# Patient Record
Sex: Male | Born: 1940 | Race: White | Hispanic: No | Marital: Married | Smoking: Current every day smoker
Health system: Southern US, Community
[De-identification: ages and names within clinical notes are randomized; demographics above are authoritative.]

## PROBLEM LIST (undated history)

## (undated) DIAGNOSIS — J449 Chronic obstructive pulmonary disease, unspecified: Secondary | ICD-10-CM

## (undated) DIAGNOSIS — I739 Peripheral vascular disease, unspecified: Secondary | ICD-10-CM

## (undated) DIAGNOSIS — I4891 Unspecified atrial fibrillation: Secondary | ICD-10-CM

## (undated) DIAGNOSIS — C61 Malignant neoplasm of prostate: Secondary | ICD-10-CM

## (undated) DIAGNOSIS — I10 Essential (primary) hypertension: Secondary | ICD-10-CM

## (undated) DIAGNOSIS — I420 Dilated cardiomyopathy: Secondary | ICD-10-CM

## (undated) DIAGNOSIS — Z720 Tobacco use: Secondary | ICD-10-CM

## (undated) DIAGNOSIS — C679 Malignant neoplasm of bladder, unspecified: Secondary | ICD-10-CM

## (undated) DIAGNOSIS — H269 Unspecified cataract: Secondary | ICD-10-CM

---

## 2007-04-05 ENCOUNTER — Ambulatory Visit: Payer: Self-pay | Admitting: Cardiology

## 2007-04-05 ENCOUNTER — Inpatient Hospital Stay (HOSPITAL_COMMUNITY): Admission: EM | Admit: 2007-04-05 | Discharge: 2007-04-06 | Payer: Self-pay | Admitting: *Deleted

## 2007-04-05 ENCOUNTER — Ambulatory Visit: Payer: Self-pay | Admitting: Internal Medicine

## 2007-04-08 ENCOUNTER — Ambulatory Visit: Payer: Self-pay | Admitting: Cardiology

## 2007-04-12 ENCOUNTER — Ambulatory Visit: Payer: Self-pay | Admitting: Family Medicine

## 2007-04-19 ENCOUNTER — Ambulatory Visit: Payer: Self-pay | Admitting: Cardiology

## 2007-04-28 ENCOUNTER — Ambulatory Visit: Payer: Self-pay | Admitting: Cardiology

## 2007-05-05 ENCOUNTER — Ambulatory Visit: Payer: Self-pay | Admitting: Cardiology

## 2007-05-13 ENCOUNTER — Ambulatory Visit: Payer: Self-pay | Admitting: Cardiology

## 2007-05-20 ENCOUNTER — Ambulatory Visit: Payer: Self-pay | Admitting: Cardiology

## 2007-05-26 ENCOUNTER — Ambulatory Visit: Payer: Self-pay | Admitting: Cardiology

## 2007-06-03 ENCOUNTER — Ambulatory Visit: Payer: Self-pay | Admitting: Cardiology

## 2007-06-10 ENCOUNTER — Ambulatory Visit: Payer: Self-pay | Admitting: Cardiology

## 2011-01-27 NOTE — Assessment & Plan Note (Signed)
[facility]                          [facility] NOTE   NAME:[name], [name]                         MRN:          [id number]  DATE:05/26/2007                            DOB:          02-Dec-1940    PRIMARY CARDIOLOGIST:  Dr. Lewayne Bunting   PRIMARY CARE PHYSICIAN:  None.   REASON FOR VISIT:  Followup.   HISTORY OF PRESENT ILLNESS:  [name] [name] is a 70 year old male patient  with a history of persistent atrial fibrillation with RVR, who presents  to the office today for followup.  He was last seen by [name] [name],  on April 12, 2007.  At that time, his Diltiazem was up-titrated, and  Toprol XL 25 mg a day was added for better blood pressure and heart rate  control.  The patient remained asymptomatic.  He denies any  palpitations, chest pain, shortness of breath, syncope, near-syncope.  Denies any orthopnea, PND, or pedal edema.   CURRENT MEDICATIONS:  1. Coumadin as directed.  2. Vitamin B12.  3. Toprol XL 25 mg daily.  4. Cardizem CD 300 mg daily.   ALLERGIES:  No known drug allergies.   PHYSICAL EXAM:  He is a well-nourished, well-developed male in no acute  distress.  Blood pressure 137/77, pulse 88, weight 163 pounds.  HEENT:  Normal.  NECK:  Without JVD.  CARDIAC:  Normal S1, S2.  Regular rate and rhythm.  LUNGS:  Clear to auscultation bilaterally.  ABDOMEN:  Soft, nontender.  EXTREMITIES:  Without edema.  Calves are soft, nontender.  Skin is warm  and dry.  NEUROLOGIC:  He is alert and oriented x3.  Cranial nerves 2-12 are  grossly intact.   Electrocardiogram reveals atrial fibrillation with a heart rate of 77,  no acute changes.   DATA BASE:  PT and INR dated May 20, 2007, 2.2; May 13, 2007,  1.6; May 05, 2007, 1.8; April 28, 2007, 2.3.   IMPRESSION:  1. Persistent atrial fibrillation with controlled ventricular rate.      a.     CHAD2 score 1.  2. Coumadin anticoagulation in anticipation of upcoming DC     cardioversion (elective).  3. Hypertension.  4. Chronic obstructive pulmonary disease.  5. Cataracts.  6. History of bladder cancer.   PLAN:  The patient presents to the office today for followup.  His blood  pressure and heart rate look much better.  He is remaining asymptomatic  with his atrial fibrillation.  We currently have him on Coumadin in  anticipation of elective cardioversion.  Once his INRs have been  therapeutic for at least 3 weeks consecutively, we will set him up for  DC cardioversion at [facility].  Since his [place] score is less  than 2, he would likely be a candidate for aspirin therapy.  He would,  of course, need to complete 4 weeks of therapeutic Coumadin therapy  postcardioversion.  Of note, his ejection fraction was 45-50% when he  was initially evaluated.  This study was difficult to read secondary to  his rapid ventricular response.  Once he is  restored to normal sinus  rhythm, we may want to consider repeating an echocardiogram to ensure  that his left ventricular function has remained normal.  I will bring  him back in routine followup in the next 3 months to ensure that he does  have normal left ventricular function.      Tereso Newcomer, PA-C  Electronically Signed      Learta Codding, MD,FACC  Electronically Signed   SW/MedQ  DD: 05/26/2007  DT: 05/27/2007  Job #: [id number]

## 2011-01-27 NOTE — Assessment & Plan Note (Signed)
[facility]                          [facility] NOTE   NAME:[name], [name]                         MRN:          [id number]  DATE:04/12/2007                            DOB:          [birth year]    PRIMARY CARDIOLOGIST:  Dr. Lewayne Bunting (new)   REASON FOR VISIT:  Post hospital followup.   [name] is a 70 year old male, with no prior cardiac history,  recently referred directly to [facility] from [facility] for further evaluation and management of asymptomatic paroxysmal  atrial fibrillation.  The patient was to have undergone elective  cataract surgery, but was found to have tachycardia and an  electrocardiogram was obtained revealing atrial fibrillation with RVR.   The patient was briefly hospitalized at [facility], ruled out for  myocardial infarction with negative serial cardiac enzymes, had a normal  TSH, and was treated initially with IV Diltiazem for rate control.  He  was discharged on Cardizem 240 daily.  He was also started on IV heparin  and transitioned to Coumadin with plans to proceed with elective DC  cardioversion in approximately 4 weeks.   His initial post hospital followup pro time was 1.1 just 4 days ago;  today, however, it has reached a therapeutic level of 2.1.   A 2-D echocardiogram was done at [facility]:  EF 45-50% with no definite  wall motion abnormalities; mild aortic root dilatation/mild aortic  regurgitation; mild mitral regurgitation; normal right ventricle; mild  left atrial dilatation.   As before, the patient continues to deny any sensation of  tachypalpitations.  He denies any exertional chest discomfort and  reports only some mild exertional dyspnea as the day progresses.  He  denies any PND, orthopnea, or lower extremity edema.   Electrocardiogram today reveals atrial fibrillation at 118 BPM with  normal axis and nonspecific ST abnormalities.   PHYSICAL EXAMINATION:  Blood  pressure 182/97, pulse 97 and regular,  weight 157.  GENERAL:  A 70 year old male sitting upright in no distress.  HEENT:  Normocephalic, atraumatic.  NECK:  Palpable bilateral carotid pulses without bruits; no JVD.  LUNGS:  Diminished breath sounds throughout but no crackles or wheezes.  HEART:  Irregularly irregular (S1, S2) with elevated heart rate.  No  significant murmurs.  ABDOMEN:  Benign.  EXTREMITIES:  No significant edema.  NEUROLOGIC:  No focal deficit.   IMPRESSION:  1. Asymptomatic paroxysmal atrial fibrillation with rapid ventricular      response.      a.     Unknown duration.      b.     CHAD2 score:  1 (hypertension).  2. Coumadin anticoagulation.  3. Hypertension.  4. Chronic obstructive pulmonary disease/tobacco.  5. Bilateral cataracts.  6. History of bladder cancer.   PLAN:  1. Adjust medications with up-titration of Cardizem to 300 daily and      addition of Toprol-XL 25 daily for better blood pressure and heart      rate control.  2. Return to clinic for followup blood pressure/pulse check with staff  RN.  3. Continue Coumadin anticoagulation and, if INR remains therapeutic      after approximately 4 weeks, proceed with attempt at DC      cardioversion.  4. Discontinue aspirin.  The patient has no documented evidence of      ischemic heart disease.  5. Schedule return clinic followup with myself and Dr. Andee Lineman in 1      month.      Gene Serpe, PA-C  Electronically Signed      Learta Codding, MD,FACC  Electronically Signed   GS/MedQ  DD: 04/12/2007  DT: 04/13/2007  Job #: [id number]

## 2011-01-27 NOTE — Discharge Summary (Signed)
NAME:  [name] [name], [name] [name]                ACCOUNT NO.:  [id number]   MEDICAL RECORD NO.:  [id number]          PATIENT TYPE:  INP   LOCATION:  3707                         FACILITY:  [facility]   PHYSICIAN:  Bevelyn Buckles. Bensimhon, MDDATE OF BIRTH:  [birth year]   DATE OF ADMISSION:  04/05/2007  DATE OF DISCHARGE:  04/06/2007                               DISCHARGE SUMMARY   PROCEDURE:  2D echocardiogram,   PRIMARY DISCHARGE DIAGNOSIS:  Atrial fibrillation with rapid ventricular  response.   SECONDARY DIAGNOSES:  1. Anticoagulation with Coumadin started this admission.  2. Ongoing tobacco use.  3. History of bladder cancer.  4. Bilateral cataracts.  5. Family history of atrial fibrillation in his mother.   TIME SPENT AT DISCHARGE:  45 minutes.   HOSPITAL COURSE:  [name] [name] is a 70 year old male with no previous  history of coronary artery disease.  He went to [facility]  on the day of admission for cataract surgery.  Upon check of his vital  signs, he was noted to have a rapid and irregular heart rate.  An EKG  did demonstrate atrial fibrillation with rapid ventricular response.  His blood pressure was elevated as well between the 130s and 170s.  His  heart rate was in the 130s to 160s.  He was transported by EMS to the  emergency room and was admitted for further evaluation.   [name] [name] was completely asymptomatic.  He had no awareness of his  atrial fibrillation and had no idea how long it had been going on.  He  was not having any symptoms such as weakness, chest pain, shortness of  breath, dizziness or presyncope.  He has never had palpitations.   A CBC, CMET and TSH were within normal limits with a TSH of 1.136.  The  only abnormality on his CMET was glucose 101 and albumin 3.4.  Magnesium  was within normal limits at 1.9.  Serial cardiac enzymes were negative  for MI.  A lipid profile showed a total cholesterol of 143 with  triglycerides 45, HDL 34 and LDL 100.  He was  started on Cardizem at 5  mg an hour for rate control, and this was fairly successful with a heart  rate in the 80s at rest.  His heart rate climbed up into the 100s with  minimal ambulation, so Dr. Diona Browner transitioned him to Cardizem 60 mg  q.6 h., and a dose was given.  His heart rate was still going up some  with exertion, but his blood pressure was in the 110s.  We will  therefore transition him to Cardizem CD 240 mg a day and up titrate this  as needed and as his blood pressure will allow.  He was started on  heparin and Coumadin.   Dr. Diona Browner did not feel that he needed complete cross coverage with  heparin to Coumadin.  He felt that he could safely be loaded with  Coumadin as an outpatient.  An echocardiogram is pending, but, since  cardiac enzymes were negative, if his EF is within normal limits, he is  tentatively  considered stable for discharge with outpatient followup  arranged.   DISCHARGE INSTRUCTIONS:  His activity level is to be increased  gradually.  He is to follow up with the Coumadin clinic on Friday at  10:00 a.m. and with Joellyn Rued, PA-C, for Dr. Andee Lineman on April 12, 2007,  at 11:45 a.m.  A smoking cessation consult was called, and he is  encouraged not to use tobacco but is reluctant to give this up.  He is  encouraged to eat a heart healthy diet.   DISCHARGE MEDICATIONS:  1. Coumadin 5 mg 1-1/2 tablets daily or as directed.  2. Cardizem CD 240 mg daily.  3. Aspirin 81 mg daily.      Theodore Demark, PA-C      Bevelyn Buckles. Bensimhon, MD  Electronically Signed    RB/MEDQ  D:  04/06/2007  T:  04/07/2007  Job:  [id number]   cc:   [facility]  Velna Hatchet, M.D.

## 2011-01-27 NOTE — H&P (Signed)
NAME:  [name] [name], [name] [name] NO.:  [id number]   MEDICAL RECORD NO.:  [id number]          PATIENT TYPE:  EMS   LOCATION:  MAJO                         FACILITY:  [facility]   PHYSICIAN:  Jonelle Sidle, MD DATE OF BIRTH:  1941-07-21   DATE OF ADMISSION:  04/05/2007  DATE OF DISCHARGE:                              HISTORY & PHYSICAL   PRIMARY CARDIOLOGIST:  The patient is new to [facility] cardiology, being  seen by Dr. Nona Dell.   PRIMARY CARE Jamison Yuhasz:  The patient does not have one.   OPHTHALMOLOGIST:  Dr. Velna Hatchet.   PATIENT PROFILE:  A 70 year old Caucasian male without prior history of  CAD who presented to the ED with asymptomatic atrial fibrillation RVR.   PROBLEM LIST:  1. A fib with RVR.  2. Ongoing tobacco abuse      a.     50 pack-year history.  Currently smoking one pack a day.  3. History of bladder CA      a.     Status post resection approximately 10 years ago.  4. History of bilateral cataracts, right greater than left      a.     The patient was to have surgery today   HISTORY OF PRESENT ILLNESS:  70 year old married Caucasian male with  history of tobacco abuse.  He reports a nearly 1 year history of dyspnea  on exertion after walking approximately 100 yards.  He otherwise denies  any history of chest pain, palpitations, lightheadedness, dizziness,  syncope or edema.  He also has 1-year history of bilateral blurring of  vision which is worse in the right eye.  He has been seen by Dr. Velna Hatchet and is noted to have cataracts.  He presented to [facility] today for right eye cataract surgery and when he was attached to  the monitor he was noted to be in atrial fibrillation with rapid  ventricular response.  He was asymptomatic then and here in the ED is  also asymptomatic.  He offers no complaints at this point.  His ECG  should shows A fib with RVR and rate between 130's and 160's.   ALLERGIES:  NO KNOWN DRUG ALLERGIES.   HOME  MEDICATIONS:  Multivitamin one daily   FAMILY HISTORY:  Mother died at age 72 following history of A fib,  hypertension and CVA.  He does not know anything about his father's  health history.  He has a half-brother who is alive and well.   SOCIAL HISTORY:  [place]ives in [place] with his wife.  He is retired  from [employer].  He has about 50 pack-year history of tobacco  abuse currently smoking one pack per day.  He denies any alcohol or  drugs.  He does not exercise but is active at home.   REVIEW OF SYSTEMS:  Positive for right greater than left eye vision  loss.  Dyspnea on exertion after walking about 100 yards.  He has a  history of hematuria about 8-10 years ago and at that time was diagnosed  with bladder cancer  and underwent resection.  Not seen urology or a  doctor in general in about 8 years.  Otherwise all systems reviewed and  negative.   PHYSICAL EXAM:  Temperature 97.9, heart rate initially 149, 126,  respirations 16, blood pressure 140/83, pulse ox 97% on room air.  Pleasant white male in no acute distress.  Awake, alert and oriented x3.  HEENT: His right eye is dilated (medically dilated at surgery center  this morning).  Otherwise HEENT is normal.  NECK:  No bruits, JVD.  LUNGS:  Respirations regular and unlabored with scattered rhonchi  throughout.  CARDIAC:  Irregular regular and tachycardiac, S1-S2, there  is no S3-S4 or murmurs.  ABDOMEN is round, soft, nontender,  nondistended.  Bowel sounds present x4.  EXTREMITIES:  Warm, dry, pink.  No clubbing, cyanosis or edema.  Dorsalis pedis, posterior tibial pulses 2+ and equal bilaterally.  NEURO is grossly intact and nonfocal.   LABORATORY DATA:  Chest x-ray:  Is pending.  EKG shows A fib with RVR,  normal axis and rate of 135 beats per minute. There is no acute ST-T  changes.  Lab work:  Hemoglobin 14.6, hematocrit 42.3, WBC 7.9,  platelets 208.  Sodium 137, potassium 3.8, chloride 107, CO2 24, BUN  15,  creatinine 0.86, glucose 125, calcium 9.1.   ASSESSMENT AND PLAN:  1. A fib with rapid ventricular response.  The patient is asymptomatic      and this is of unknown duration.  He has a [place] score of probably      one.  (He is been hypertensive both in the ED as well as at the      surgery center).  I will plan to add heparin and IV diltiazem.      Check TSH, magnesium and echo once rate control is achieved  His      electrolytes otherwise okay.  For now, goal will be rate control.      However as presumably this is the patient's first presentation with      A fib would like to give him a chance at rhythm control and      therefore we will initiate Coumadin with consideration for      cardioversion down the road.  2. Hypertension.  The patient will be treated IV diltiazem for right      now and this will eventually be converted to oral diltiazem.  3. Tobacco abuse smoking cessation strongly advised.  4.?  Impaired fasting glucose.  The patient has not eaten anything this  morning.  His glucose by BMET is 125.  Will follow this.      Nicolasa Ducking, ANP      Jonelle Sidle, MD  Electronically Signed    CB/MEDQ  D:  04/05/2007  T:  04/05/2007  Job:  [id number]

## 2011-06-29 LAB — LIPID PANEL
HDL: 34 — ABNORMAL LOW
LDL Cholesterol: 100 — ABNORMAL HIGH
Total CHOL/HDL Ratio: 4.2
Triglycerides: 45
VLDL: 9

## 2011-06-29 LAB — BASIC METABOLIC PANEL
BUN: 15
Calcium: 9.1
Creatinine, Ser: 0.86
GFR calc non Af Amer: >60

## 2011-06-29 LAB — DIFFERENTIAL
Eosinophils Absolute: 0
Lymphocytes Relative: 20
Lymphs Abs: 1.6
Neutrophils Relative %: 77

## 2011-06-29 LAB — CARDIAC PANEL(CRET KIN+CKTOT+MB+TROPI)
CK, MB: 1.1
Relative Index: INVALID
Total CK: 44
Troponin I: 0.02
Troponin I: 0.03

## 2011-06-29 LAB — COMPREHENSIVE METABOLIC PANEL
ALT: 16
AST: 17
Albumin: 3.4 — ABNORMAL LOW
Alkaline Phosphatase: 76
Chloride: 103
Potassium: 3.9
Sodium: 137
Total Bilirubin: 1.1
Total Protein: 6.2

## 2011-06-29 LAB — APTT: aPTT: 32

## 2011-06-29 LAB — HEPARIN LEVEL (UNFRACTIONATED)
Heparin Unfractionated: 0.44
Heparin Unfractionated: 0.56

## 2011-06-29 LAB — CBC
HCT: 42.3
MCV: 95.8
Platelets: 206
Platelets: 208
RDW: 13.9
WBC: 7.9
WBC: 8.1

## 2011-06-29 LAB — TROPONIN I: Troponin I: 0.01

## 2011-06-29 LAB — CK TOTAL AND CKMB (NOT AT ARMC)
Relative Index: INVALID
Total CK: 55

## 2011-06-29 LAB — PROTIME-INR: INR: 1.1

## 2013-08-19 ENCOUNTER — Encounter (HOSPITAL_COMMUNITY): Payer: Self-pay | Admitting: Internal Medicine

## 2013-08-19 ENCOUNTER — Inpatient Hospital Stay (HOSPITAL_COMMUNITY)
Admission: EM | Admit: 2013-08-19 | Discharge: 2013-08-22 | DRG: 253 | Disposition: A | Discharge status: Home / Self Care | Payer: PRIVATE HEALTH INSURANCE | Attending: Vascular Surgery | Admitting: Vascular Surgery

## 2013-08-19 ENCOUNTER — Encounter (HOSPITAL_COMMUNITY): Payer: PRIVATE HEALTH INSURANCE | Admitting: Anesthesiology

## 2013-08-19 ENCOUNTER — Emergency Department (HOSPITAL_COMMUNITY): Payer: PRIVATE HEALTH INSURANCE | Admitting: Anesthesiology

## 2013-08-19 ENCOUNTER — Encounter (HOSPITAL_COMMUNITY)
Admission: EM | Disposition: A | Discharge status: Home / Self Care | Payer: Self-pay | Source: Home / Self Care | Attending: Vascular Surgery

## 2013-08-19 DIAGNOSIS — I70209 Unspecified atherosclerosis of native arteries of extremities, unspecified extremity: Secondary | ICD-10-CM | POA: Diagnosis present

## 2013-08-19 DIAGNOSIS — Z8546 Personal history of malignant neoplasm of prostate: Secondary | ICD-10-CM

## 2013-08-19 DIAGNOSIS — I129 Hypertensive chronic kidney disease with stage 1 through stage 4 chronic kidney disease, or unspecified chronic kidney disease: Secondary | ICD-10-CM | POA: Diagnosis present

## 2013-08-19 DIAGNOSIS — I7409 Other arterial embolism and thrombosis of abdominal aorta: Principal | ICD-10-CM | POA: Diagnosis present

## 2013-08-19 DIAGNOSIS — N183 Chronic kidney disease, stage 3 unspecified: Secondary | ICD-10-CM | POA: Diagnosis present

## 2013-08-19 DIAGNOSIS — I2510 Atherosclerotic heart disease of native coronary artery without angina pectoris: Secondary | ICD-10-CM | POA: Diagnosis present

## 2013-08-19 DIAGNOSIS — J4489 Other specified chronic obstructive pulmonary disease: Secondary | ICD-10-CM | POA: Diagnosis present

## 2013-08-19 DIAGNOSIS — I4891 Unspecified atrial fibrillation: Secondary | ICD-10-CM | POA: Diagnosis not present

## 2013-08-19 DIAGNOSIS — I70219 Atherosclerosis of native arteries of extremities with intermittent claudication, unspecified extremity: Secondary | ICD-10-CM

## 2013-08-19 DIAGNOSIS — I743 Embolism and thrombosis of arteries of the lower extremities: Secondary | ICD-10-CM | POA: Diagnosis present

## 2013-08-19 DIAGNOSIS — F17200 Nicotine dependence, unspecified, uncomplicated: Secondary | ICD-10-CM | POA: Diagnosis present

## 2013-08-19 DIAGNOSIS — I428 Other cardiomyopathies: Secondary | ICD-10-CM | POA: Diagnosis present

## 2013-08-19 DIAGNOSIS — D494 Neoplasm of unspecified behavior of bladder: Secondary | ICD-10-CM | POA: Diagnosis present

## 2013-08-19 DIAGNOSIS — J449 Chronic obstructive pulmonary disease, unspecified: Secondary | ICD-10-CM | POA: Diagnosis present

## 2013-08-19 DIAGNOSIS — C61 Malignant neoplasm of prostate: Secondary | ICD-10-CM | POA: Diagnosis present

## 2013-08-19 DIAGNOSIS — R319 Hematuria, unspecified: Secondary | ICD-10-CM | POA: Diagnosis present

## 2013-08-19 DIAGNOSIS — N289 Disorder of kidney and ureter, unspecified: Secondary | ICD-10-CM | POA: Diagnosis present

## 2013-08-19 DIAGNOSIS — I10 Essential (primary) hypertension: Secondary | ICD-10-CM | POA: Diagnosis present

## 2013-08-19 DIAGNOSIS — I70229 Atherosclerosis of native arteries of extremities with rest pain, unspecified extremity: Secondary | ICD-10-CM

## 2013-08-19 HISTORY — DX: Unspecified atrial fibrillation: I48.91

## 2013-08-19 HISTORY — DX: Malignant neoplasm of prostate: C61

## 2013-08-19 HISTORY — DX: Tobacco use: Z72.0

## 2013-08-19 HISTORY — DX: Dilated cardiomyopathy: I42.0

## 2013-08-19 HISTORY — PX: AXILLARY-FEMORAL BYPASS GRAFT: SHX894

## 2013-08-19 HISTORY — DX: Essential (primary) hypertension: I10

## 2013-08-19 HISTORY — DX: Peripheral vascular disease, unspecified: I73.9

## 2013-08-19 HISTORY — DX: Malignant neoplasm of bladder, unspecified: C67.9

## 2013-08-19 HISTORY — DX: Unspecified cataract: H26.9

## 2013-08-19 HISTORY — DX: Chronic obstructive pulmonary disease, unspecified: J44.9

## 2013-08-19 LAB — CBC
Hemoglobin: 12.1 g/dL — ABNORMAL LOW (ref 13.0–17.0)
MCH: 32.4 pg (ref 26.0–34.0)
MCHC: 34.9 g/dL (ref 30.0–36.0)
MCV: 92.8 fL (ref 78.0–100.0)
Platelets: 194 10*3/uL (ref 150–400)

## 2013-08-19 LAB — POCT I-STAT 7, (LYTES, BLD GAS, ICA,H+H)
Bicarbonate: 27.2 mEq/L — ABNORMAL HIGH (ref 20.0–24.0)
Calcium, Ion: 1.15 mmol/L (ref 1.13–1.30)
Hemoglobin: 8.5 g/dL — ABNORMAL LOW (ref 13.0–17.0)
O2 Saturation: 99 %
Patient temperature: 36.1
Potassium: 3.4 mEq/L — ABNORMAL LOW (ref 3.5–5.1)
TCO2: 28 mmol/L (ref 0–100)
pCO2 arterial: 41.5 mmHg (ref 35.0–45.0)
pH, Arterial: 7.421 (ref 7.350–7.450)

## 2013-08-19 LAB — ABO/RH: ABO/RH(D): A POS

## 2013-08-19 LAB — CREATININE, SERUM: Creatinine, Ser: 1.73 mg/dL — ABNORMAL HIGH (ref 0.50–1.35)

## 2013-08-19 SURGERY — CREATION, BYPASS, ARTERIAL, AXILLARY TO BILATERAL FEMORAL, USING GRAFT
Anesthesia: General | Site: Axilla | Laterality: Bilateral

## 2013-08-19 MED ORDER — ARTIFICIAL TEARS OP OINT
TOPICAL_OINTMENT | OPHTHALMIC | Status: DC | PRN
  Administered 2013-08-19: 1 via OPHTHALMIC

## 2013-08-19 MED ORDER — MORPHINE SULFATE 2 MG/ML IJ SOLN
2.0000 mg | INTRAMUSCULAR | Status: DC | PRN
  Filled 2013-08-19: qty 1

## 2013-08-19 MED ORDER — ROCURONIUM BROMIDE 100 MG/10ML IV SOLN
INTRAVENOUS | Status: DC | PRN
  Administered 2013-08-19: 50 mg via INTRAVENOUS

## 2013-08-19 MED ORDER — ESMOLOL HCL 10 MG/ML IV SOLN
INTRAVENOUS | Status: DC | PRN
  Administered 2013-08-19: 10 mg via INTRAVENOUS
  Administered 2013-08-19: 20 mg via INTRAVENOUS

## 2013-08-19 MED ORDER — SODIUM CHLORIDE 0.9 % IV SOLN: 500.0000 mL | Freq: Once | INTRAVENOUS | Status: AC | PRN

## 2013-08-19 MED ORDER — LACTATED RINGERS IV SOLN
INTRAVENOUS | Status: DC | PRN
  Administered 2013-08-19 (×2): via INTRAVENOUS

## 2013-08-19 MED ORDER — PROTAMINE SULFATE 10 MG/ML IV SOLN
INTRAVENOUS | Status: DC | PRN
  Administered 2013-08-19: 50 mg via INTRAVENOUS

## 2013-08-19 MED ORDER — LABETALOL HCL 5 MG/ML IV SOLN
10.0000 mg | INTRAVENOUS | Status: DC | PRN
  Filled 2013-08-19: qty 4

## 2013-08-19 MED ORDER — ACETAMINOPHEN 325 MG PO TABS: 325.0000 mg | ORAL_TABLET | ORAL | Status: DC | PRN

## 2013-08-19 MED ORDER — ALUM & MAG HYDROXIDE-SIMETH 200-200-20 MG/5ML PO SUSP: 15.0000 mL | ORAL | Status: DC | PRN

## 2013-08-19 MED ORDER — PHENYLEPHRINE HCL 10 MG/ML IJ SOLN
10.0000 mg | INTRAVENOUS | Status: DC | PRN
  Administered 2013-08-19: 50 ug/min via INTRAVENOUS

## 2013-08-19 MED ORDER — SODIUM CHLORIDE 0.9 % IV SOLN
INTRAVENOUS | Status: DC | PRN
  Administered 2013-08-19: 09:00:00 via INTRAVENOUS

## 2013-08-19 MED ORDER — DEXTROSE 5 % IV SOLN
100.0000 mg | INTRAVENOUS | Status: DC | PRN
  Administered 2013-08-19: 5 mg/h via INTRAVENOUS

## 2013-08-19 MED ORDER — ONDANSETRON HCL 4 MG/2ML IJ SOLN
4.0000 mg | Freq: Four times a day (QID) | INTRAMUSCULAR | Status: DC | PRN
  Administered 2013-08-22: 4 mg via INTRAVENOUS
  Filled 2013-08-19: qty 2

## 2013-08-19 MED ORDER — LACTATED RINGERS IV SOLN
INTRAVENOUS | Status: DC | PRN
  Administered 2013-08-19: 06:00:00 via INTRAVENOUS

## 2013-08-19 MED ORDER — LIDOCAINE HCL (CARDIAC) 20 MG/ML IV SOLN
INTRAVENOUS | Status: DC | PRN
  Administered 2013-08-19: 100 mg via INTRAVENOUS

## 2013-08-19 MED ORDER — DILTIAZEM HCL 30 MG PO TABS
30.0000 mg | ORAL_TABLET | Freq: Four times a day (QID) | ORAL | Status: DC
  Administered 2013-08-19 – 2013-08-20 (×3): 30 mg via ORAL
  Filled 2013-08-19 (×7): qty 1

## 2013-08-19 MED ORDER — SODIUM CHLORIDE 0.9 % IV SOLN
INTRAVENOUS | Status: DC
  Administered 2013-08-19 – 2013-08-20 (×2): via INTRAVENOUS

## 2013-08-19 MED ORDER — HEMOSTATIC AGENTS (NO CHARGE) OPTIME
TOPICAL | Status: DC | PRN
  Administered 2013-08-19 (×2): 1 via TOPICAL

## 2013-08-19 MED ORDER — OXYCODONE HCL 5 MG/5ML PO SOLN: 5.0000 mg | Freq: Once | ORAL | Status: DC | PRN

## 2013-08-19 MED ORDER — GUAIFENESIN-DM 100-10 MG/5ML PO SYRP: 15.0000 mL | ORAL_SOLUTION | ORAL | Status: DC | PRN

## 2013-08-19 MED ORDER — POTASSIUM CHLORIDE CRYS ER 20 MEQ PO TBCR: 20.0000 meq | EXTENDED_RELEASE_TABLET | Freq: Once | ORAL | Status: AC | PRN

## 2013-08-19 MED ORDER — ASPIRIN 81 MG PO CHEW
81.0000 mg | CHEWABLE_TABLET | Freq: Every day | ORAL | Status: DC
  Administered 2013-08-19 – 2013-08-22 (×4): 81 mg via ORAL
  Filled 2013-08-19 (×4): qty 1

## 2013-08-19 MED ORDER — DEXTROSE 5 % IV SOLN
1.5000 g | Freq: Two times a day (BID) | INTRAVENOUS | Status: AC
  Administered 2013-08-19 (×2): 1.5 g via INTRAVENOUS
  Filled 2013-08-19 (×2): qty 1.5

## 2013-08-19 MED ORDER — FENTANYL CITRATE 0.05 MG/ML IJ SOLN
INTRAMUSCULAR | Status: DC | PRN
  Administered 2013-08-19: 50 ug via INTRAVENOUS
  Administered 2013-08-19 (×2): 100 ug via INTRAVENOUS
  Administered 2013-08-19: 50 ug via INTRAVENOUS

## 2013-08-19 MED ORDER — HYDROMORPHONE HCL PF 1 MG/ML IJ SOLN: 0.2500 mg | INTRAMUSCULAR | Status: DC | PRN

## 2013-08-19 MED ORDER — CEFAZOLIN SODIUM-DEXTROSE 2-3 GM-% IV SOLR
INTRAVENOUS | Status: DC | PRN
  Administered 2013-08-19: 2 g via INTRAVENOUS

## 2013-08-19 MED ORDER — PHENOL 1.4 % MT LIQD: 1.0000 | OROMUCOSAL | Status: DC | PRN

## 2013-08-19 MED ORDER — DOCUSATE SODIUM 100 MG PO CAPS
100.0000 mg | ORAL_CAPSULE | Freq: Every day | ORAL | Status: DC
  Administered 2013-08-20 – 2013-08-22 (×3): 100 mg via ORAL
  Filled 2013-08-19 (×3): qty 1

## 2013-08-19 MED ORDER — ENOXAPARIN SODIUM 30 MG/0.3ML ~~LOC~~ SOLN
30.0000 mg | SUBCUTANEOUS | Status: DC
  Administered 2013-08-20 – 2013-08-22 (×3): 30 mg via SUBCUTANEOUS
  Filled 2013-08-19 (×3): qty 0.3

## 2013-08-19 MED ORDER — DILTIAZEM HCL 100 MG IV SOLR
5.0000 mg/h | INTRAVENOUS | Status: DC
  Filled 2013-08-19: qty 100

## 2013-08-19 MED ORDER — ACETAMINOPHEN 650 MG RE SUPP: 325.0000 mg | RECTAL | Status: DC | PRN

## 2013-08-19 MED ORDER — METOPROLOL TARTRATE 1 MG/ML IV SOLN
2.0000 mg | INTRAVENOUS | Status: AC | PRN
  Administered 2013-08-20 (×2): 5 mg via INTRAVENOUS
  Filled 2013-08-19 (×2): qty 5

## 2013-08-19 MED ORDER — ATORVASTATIN CALCIUM 10 MG PO TABS
10.0000 mg | ORAL_TABLET | Freq: Every day | ORAL | Status: DC
  Administered 2013-08-19 – 2013-08-22 (×4): 10 mg via ORAL
  Filled 2013-08-19 (×5): qty 1

## 2013-08-19 MED ORDER — HEPARIN SODIUM (PORCINE) 1000 UNIT/ML IJ SOLN
INTRAMUSCULAR | Status: DC | PRN
  Administered 2013-08-19: 6000 [IU] via INTRAVENOUS
  Administered 2013-08-19: 2000 [IU] via INTRAVENOUS

## 2013-08-19 MED ORDER — HEPARIN SODIUM (PORCINE) 5000 UNIT/ML IJ SOLN
INTRAMUSCULAR | Status: DC | PRN
  Administered 2013-08-19: 06:00:00

## 2013-08-19 MED ORDER — MIDAZOLAM HCL 2 MG/2ML IJ SOLN
INTRAMUSCULAR | Status: DC | PRN
  Administered 2013-08-19: 2 mg via INTRAVENOUS

## 2013-08-19 MED ORDER — ZOLPIDEM TARTRATE 5 MG PO TABS: 5.0000 mg | ORAL_TABLET | Freq: Every evening | ORAL | Status: DC | PRN

## 2013-08-19 MED ORDER — PROPOFOL 10 MG/ML IV BOLUS
INTRAVENOUS | Status: DC | PRN
  Administered 2013-08-19: 150 mg via INTRAVENOUS

## 2013-08-19 MED ORDER — PANTOPRAZOLE SODIUM 40 MG PO TBEC
40.0000 mg | DELAYED_RELEASE_TABLET | Freq: Every day | ORAL | Status: DC
  Administered 2013-08-19 – 2013-08-22 (×4): 40 mg via ORAL
  Filled 2013-08-19 (×4): qty 1

## 2013-08-19 MED ORDER — OXYCODONE HCL 5 MG PO TABS: 5.0000 mg | ORAL_TABLET | Freq: Once | ORAL | Status: DC | PRN

## 2013-08-19 MED ORDER — DOPAMINE-DEXTROSE 3.2-5 MG/ML-% IV SOLN: 3.0000 ug/kg/min | INTRAVENOUS | Status: DC

## 2013-08-19 MED ORDER — HYDRALAZINE HCL 20 MG/ML IJ SOLN: 10.0000 mg | INTRAMUSCULAR | Status: DC | PRN

## 2013-08-19 MED ORDER — ONDANSETRON HCL 4 MG/2ML IJ SOLN: 4.0000 mg | Freq: Four times a day (QID) | INTRAMUSCULAR | Status: DC | PRN

## 2013-08-19 MED ORDER — OXYCODONE HCL 5 MG PO TABS
5.0000 mg | ORAL_TABLET | ORAL | Status: DC | PRN
  Administered 2013-08-19 (×3): 5 mg via ORAL
  Administered 2013-08-20 (×2): 10 mg via ORAL
  Filled 2013-08-19: qty 2
  Filled 2013-08-19 (×3): qty 1
  Filled 2013-08-19: qty 2

## 2013-08-19 MED ORDER — SUCCINYLCHOLINE CHLORIDE 20 MG/ML IJ SOLN
INTRAMUSCULAR | Status: DC | PRN
  Administered 2013-08-19: 120 mg via INTRAVENOUS

## 2013-08-19 MED ORDER — PHENYLEPHRINE HCL 10 MG/ML IJ SOLN
INTRAMUSCULAR | Status: DC | PRN
  Administered 2013-08-19 (×2): 40 ug via INTRAVENOUS

## 2013-08-19 MED ORDER — ALBUMIN HUMAN 5 % IV SOLN
INTRAVENOUS | Status: DC | PRN
  Administered 2013-08-19 (×2): via INTRAVENOUS

## 2013-08-19 SURGICAL SUPPLY — 54 items
CANISTER SUCTION 2500CC (MISCELLANEOUS) ×2 IMPLANT
CATH EMB 4FR 80CM (CATHETERS) ×2 IMPLANT
CLIP TI MEDIUM 24 (CLIP) ×2 IMPLANT
CLIP TI WIDE RED SMALL 24 (CLIP) ×2 IMPLANT
COVER SURGICAL LIGHT HANDLE (MISCELLANEOUS) ×2 IMPLANT
DERMABOND ADHESIVE PROPEN (GAUZE/BANDAGES/DRESSINGS) ×3
DERMABOND ADVANCED (GAUZE/BANDAGES/DRESSINGS) ×1
DERMABOND ADVANCED .7 DNX12 (GAUZE/BANDAGES/DRESSINGS) ×1 IMPLANT
DERMABOND ADVANCED .7 DNX6 (GAUZE/BANDAGES/DRESSINGS) ×3 IMPLANT
DRAIN SNY 10X20 3/4 PERF (WOUND CARE) IMPLANT
DRAPE INCISE IOBAN 66X45 STRL (DRAPES) ×4 IMPLANT
DRAPE WARM FLUID 44X44 (DRAPE) ×2 IMPLANT
DRESSING ALLEVYN LIFE SACRUM (GAUZE/BANDAGES/DRESSINGS) ×2 IMPLANT
ELECT REM PT RETURN 9FT ADLT (ELECTROSURGICAL) ×2
ELECTRODE REM PT RTRN 9FT ADLT (ELECTROSURGICAL) ×1 IMPLANT
EVACUATOR SILICONE 100CC (DRAIN) IMPLANT
GLOVE BIO SURGEON STRL SZ 6.5 (GLOVE) ×4 IMPLANT
GLOVE BIOGEL PI IND STRL 6.5 (GLOVE) ×1 IMPLANT
GLOVE BIOGEL PI INDICATOR 6.5 (GLOVE) ×1
GLOVE SS BIOGEL STRL SZ 7 (GLOVE) ×3 IMPLANT
GLOVE SUPERSENSE BIOGEL SZ 7 (GLOVE) ×3
GOWN STRL NON-REIN LRG LVL3 (GOWN DISPOSABLE) ×6 IMPLANT
GRAFT CV 60X8STRG TUBE KNTD (Vascular Products) ×1 IMPLANT
GRAFT HEMASHIELD 8MM (Vascular Products) ×2 IMPLANT
GRAFT VASC STRG 30X8KNIT (Vascular Products) ×1 IMPLANT
HEMOSTAT SNOW SURGICEL 2X4 (HEMOSTASIS) ×4 IMPLANT
INSERT FOGARTY SM (MISCELLANEOUS) ×6 IMPLANT
KIT BASIN OR (CUSTOM PROCEDURE TRAY) ×2 IMPLANT
KIT ROOM TURNOVER OR (KITS) ×2 IMPLANT
LOOP VESSEL MAXI BLUE (MISCELLANEOUS) ×2 IMPLANT
LOOP VESSEL MINI RED (MISCELLANEOUS) ×2 IMPLANT
NS IRRIG 1000ML POUR BTL (IV SOLUTION) ×4 IMPLANT
PACK PERIPHERAL VASCULAR (CUSTOM PROCEDURE TRAY) ×2 IMPLANT
PAD ARMBOARD 7.5X6 YLW CONV (MISCELLANEOUS) ×4 IMPLANT
PUNCH AORTIC ROT 5.0MM RCL 50 (MISCELLANEOUS) ×2 IMPLANT
SPONGE GAUZE 4X4 12PLY (GAUZE/BANDAGES/DRESSINGS) ×2 IMPLANT
STAPLER VISISTAT 35W (STAPLE) ×2 IMPLANT
SUT PROLENE 5 0 C 1 24 (SUTURE) ×4 IMPLANT
SUT PROLENE 5 0 CC1 (SUTURE) ×2 IMPLANT
SUT PROLENE 6 0 BV (SUTURE) ×4 IMPLANT
SUT PROLENE 6 0 CC (SUTURE) ×4 IMPLANT
SUT SILK 2 0 SH (SUTURE) ×2 IMPLANT
SUT SILK 3 0 (SUTURE) ×1
SUT SILK 3-0 18XBRD TIE 12 (SUTURE) ×1 IMPLANT
SUT VIC AB 2-0 CTX 27 (SUTURE) ×2 IMPLANT
SUT VIC AB 2-0 CTX 36 (SUTURE) ×2 IMPLANT
SUT VIC AB 3-0 SH 27 (SUTURE) ×3
SUT VIC AB 3-0 SH 27X BRD (SUTURE) ×3 IMPLANT
SUT VICRYL 4-0 PS2 18IN ABS (SUTURE) ×4 IMPLANT
SYR 3ML LL SCALE MARK (SYRINGE) ×2 IMPLANT
TOWEL OR 17X24 6PK STRL BLUE (TOWEL DISPOSABLE) ×4 IMPLANT
TOWEL OR 17X26 10 PK STRL BLUE (TOWEL DISPOSABLE) ×4 IMPLANT
TRAY FOLEY CATH 16FRSI W/METER (SET/KITS/TRAYS/PACK) ×2 IMPLANT
WATER STERILE IRR 1000ML POUR (IV SOLUTION) ×2 IMPLANT

## 2013-08-19 NOTE — Anesthesia Procedure Notes (Signed)
Procedure Name: Intubation Date/Time: 08/19/2013 5:28 AM Performed by: Luster Landsberg Pre-anesthesia Checklist: Patient identified, Emergency Drugs available, Suction available and Patient being monitored Patient Re-evaluated:Patient Re-evaluated prior to inductionOxygen Delivery Method: Circle system utilized Preoxygenation: Pre-oxygenation with 100% oxygen Intubation Type: IV induction, Rapid sequence and Cricoid Pressure applied Laryngoscope Size: Mac and 3 Grade View: Grade I Tube type: Oral Tube size: 7.5 mm Number of attempts: 1 Airway Equipment and Method: Stylet Placement Confirmation: ETT inserted through vocal cords under direct vision,  positive ETCO2 and breath sounds checked- equal and bilateral Secured at: 23 cm Tube secured with: Tape Dental Injury: Teeth and Oropharynx as per pre-operative assessment

## 2013-08-19 NOTE — Preoperative (Signed)
Beta Blockers   Reason not to administer Beta Blockers:given intra-op

## 2013-08-19 NOTE — H&P (Signed)
  Vascular Surgery H&P  Chief Complaint: Pain in both lower extremities x12 hours left greater than right HPI: [name] [name] is a 72 y.o. male who presents for evaluation of pain in both lower extremities left greater than right x12 hours. Patient reported to [facility] emergency department with history of onset of discomfort sometime yesterday left leg greater than right. He has a history of severe buttock and thigh claudication at one half block or walking up inclines. Recently had diagnosis of prostate cancer. Patient has long history of tobacco abuse. He has 50+ pack year history. Emergency department at [facility] perform CT angiogram which reveals a distal aortic occlusion. 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 No past surgical history on file. History   Social History  . Marital Status: Married    Spouse Name: N/A    Number of Children: N/A  . Years of Education: N/A   Social History Main Topics  . Smoking status: Not on file  . Smokeless tobacco: Not on file  . Alcohol Use: Not on file  . Drug Use: Not on file  . Sexual Activity: Not on file   Other Topics Concern  . Not on file   Social History Narrative  . No narrative on file   No family history on file. Allergies not on file Prior to Admission medications   Not on File     Positive ROS: Denies chest pain, PND, orthopnea. Does have COPD with 50+ pack year history of smoking one pack per day currently. Recent diagnosis prostate cancer with hematuria. Complains of buttock and thigh claudication prior to acute onset of constant pain other systems negative and complete review of systems  All other systems have been reviewed and were otherwise negative with the exception of those mentioned in  the HPI and as above.  Physical Exam: There were no vitals filed for this visit.  General: Alert, no acute distress HEENT: Normal for age Cardiovascular: Regular rate and rhythm. Carotid pulses 2+, no bruits audible Respiratory: Clear to auscultation. No cyanosis, no use of accessory musculature GI: No organomegaly, abdomen is soft and non-tender Skin: No lesions in the area of chief complaint Neurologic: Sensation intact distally Psychiatric: Patient is competent for consent with normal mood and affect Musculoskeletal: No obvious deformities Extremities: Upper extremities with 3+ brachial and radial pulses palpable bilaterally. Lower extremities have absent femoral and distal pulses. Decreased sensation both feet left worse than right. Patient has no calf tenderness. Is able to elevate his legs against gravity. Does have motion in the feet.   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 Risks and benefits thoroughly discussed with the patient he would like to proceed.   Josephina Gip, MD 08/19/2013 5:12 AM

## 2013-08-19 NOTE — Consult Note (Signed)
ELECTROPHYSIOLOGY CONSULT NOTE  Patient ID: [name] [name], MRN: [id number], DOB/AGE: Jan 04, 1941 72 y.o. Admit date: 08/19/2013 Date of Consult: 08/19/2013  Primary Physician: Ignatius Specking., MD Primary Cardiologist: [facility]  Chief Complaint:  Atrial fib   HPI [name] [name] is a 72 y.o. 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    Surgical History: No past surgical history on file.   Home Meds: Prior to Admission medications   Medication Sig Start Date End Date Taking? Authorizing Provider  Multiple Vitamins-Minerals (MULTIVITAMIN WITH MINERALS) tablet Take 1 tablet by mouth daily.   Yes Historical Provider, MD    Inpatient Medications:  . aspirin  81 mg Oral Daily  . atorvastatin  10 mg Oral q1800  . cefUROXime (ZINACEF)  IV  1.5 g Intravenous Q12H  . [START ON 08/20/2013] docusate sodium  100 mg Oral Daily  . [START ON 08/20/2013] enoxaparin (LOVENOX) injection  30 mg Subcutaneous Q24H  . pantoprazole  40 mg Oral Daily     Allergies: No Known Allergies  History   Social History  . Marital Status: Married    Spouse Name: N/A    Number of Children: N/A  . Years of Education: N/A   Occupational History  . Not on file.   Social History Main Topics  . Smoking status: Not on file  . Smokeless tobacco: Not on file  . Alcohol Use: Not on file  . Drug Use: Not on file  . Sexual Activity: Not on file   Other Topics Concern  . Not on file   Social History Narrative  . No narrative on file     No family history on file.   ROS:  Please see the history of present  illness.     All other systems reviewed and negative.    Physical Exam:   Blood pressure 138/69, pulse 117, temperature 97.3 F (36.3 C), temperature source Oral, resp. rate 20, height 5\' 8"  (1.727 m), weight 156 lb 15.5 oz (71.2 kg), SpO2 98.00%. General: Well developed, cachectic  male in no acute distress. Head: Normocephalic, atraumatic, sclera non-icteric, no xanthomas, nares are without discharge. EENT: normal Lymph Nodes:  none Back: not examined Neck: quiet carotid bruits. JVD 6-8 Lungs: Clear bilaterally to auscultation without wheezes, rales, or rhonchi. Breathing is unlabored. Heart: irrefularly irregular and rapid with S1 S2. No*  murmur , rubs, or gallops appreciated. Abdomen: Soft, non-tender, non-distended with normoactive bowel sounds. No hepatomegaly. No rebound/guarding. No obvious abdominal masses. Msk:  Strength and tone appear normal for age. Extremities: No clubbing or cyanosis. No  edema.  Distal  Extremities are warm Skin: Warm and Dry Neuro: Alert and oriented X 3. CN III-XII intact Grossly normal sensory and motor function . Psych:  Responds to questions appropriately with a normal affect.      Labs: Cardiac Enzymes No results found for this basename: CKTOTAL, CKMB, TROPONINI,  in the last 72 hours CBC Lab Results  Component Value Date   WBC 8.1 04/06/2007   HGB 8.5* 08/19/2013  HCT 25.0* 08/19/2013   MCV 97.0 04/06/2007   PLT 206 04/06/2007   PROTIME: No results found for this basename: LABPROT, INR,  in the last 72 hours Chemistry  Recent Labs Lab 08/19/13 0725  NA 138  K 3.4*   Lipids Lab Results  Component Value Date   CHOL  Value: 143        ATP III CLASSIFICATION:  <200     mg/dL   Desirable  409-811  mg/dL   Borderline High  >=914    mg/dL   High 7/82/9562   HDL 34* 04/06/2007   LDLCALC  Value: 100        Total Cholesterol/HDL:CHD Risk Coronary Heart Disease Risk Table                     Men   Women  1/2 Average Risk   3.4   3.3* 04/06/2007    TRIG 45 04/06/2007   BNP No results found for this basename: probnp   Miscellaneous No results found for this basename: DDIMER    Radiology/Studies:  No results found.   EKG:  afib 105 --//09/.35 LVH repol   Assessment and Plan:   Afib persistent LV dysfunction by echo 2008 Multiple TERF Per VAS Dis s/p emergent lower extremity revascularization Renal Insuff  Cr>2.1 yday at [place]  A number of cardiac issues bear addressing and appreciate the opportunity to do so AFib RVR  With prior cardiomyopathy>>need to control the HR will use  CCB for now, but will ask Dr Hart Rochester whether BB(perhaps coreg with alpha blockade) would be ok as it would be better for his heart.  He was on metoprolol as outpt? When was it started? Could it have triggered anything  Digoxin  a little tricky with renal insufficiency of unknown duration or whether it is static or not Will reassess LVEF by echo Can he go on anticoagulant--either NOAC or coumadin, although with hx of noncompliance not sure best long term strategy.  For now on asa     Sherryl Manges

## 2013-08-19 NOTE — Progress Notes (Signed)
2nd unit of blood completed pt tolerated well.VSS See flow sheet.

## 2013-08-19 NOTE — Transfer of Care (Signed)
Immediate Anesthesia Transfer of Care Note  Patient: [name] [name]  Procedure(s) Performed: Procedure(s) with comments: BYPASS GRAFT AXILLA-BIFEMORAL (Bilateral) - Right axilla to right Femoral to Left Femoral bypass use hemoshield grafts.  Patient Location: PACU  Anesthesia Type:General  Level of Consciousness: sedated  Airway & Oxygen Therapy: Patient Spontanous Breathing and Patient connected to nasal cannula oxygen  Post-op Assessment: Report given to PACU RN and Post -op Vital signs reviewed and stable  Post vital signs: Reviewed and stable  Complications: No apparent anesthesia complications

## 2013-08-19 NOTE — Op Note (Signed)
OPERATIVE REPORT  Date of Surgery: 08/19/2013  Surgeon: Josephina Gip, MD  Assistant: Lorrine Kin  Pre-op Diagnosis: Severe ischemia bilateral lower extremities secondary to acute aortic occlusion  Post-op Diagnosis: Same plus severe diffuse femoral artery occlusive disease  Procedure: Procedure(s): #1 right axillofemoral bypass using 8 mm Hemashield Dacron graft with extensive right common femoral superficial and profunda femoris endarterectomy #2 right to left femoral-femoral bypass using 8 mm Hemashield Dacron graft  Anesthesia: General  EBL: 200 cc  Complications: None  Patient was taken to the operating room placed in the supine position at which time satisfactory general endotracheal anesthesia was administered. The right axillary artery was exposed through a infraclavicular incision. Carried down to subcutaneous tissue dissection continued along the fibers of the pectoralis major muscle. The cross minor muscle was divided exposing the axillary artery. It was deep and cephalad to the axillary vein. Artery was dissected free an excellent pulse. Longitudinal incisions were then made in both inguinal areas and the common superficial and profunda femoris arteries dissected free. There was diffuse calcific occlusive disease throughout both common femoral systems. There was no pulse on either side. Tunnel was then made from the maxillary wound of the right inguinal wound along the axillary line an 8 mm Hemashield Dacron graft 60 cm was delivered through that tunnel. Another suprapubic tunnel was created connecting the 2 inguinal wounds and an 8 mm Hemashield Dacron graft was delivered through this tunnel-30 cm. Patient was then heparinized. Axillary artery was occluded proximally and distally with vascular clamps opened with 15 blade extended with Potts scissors. The graft was slightly spatulated and anastomosed end-to-side with 6-0 Prolene. Following this attention was turned to the  inguinal area on the right. The external iliac was occluded proximally and the superficial femoral and profunda distally. Longitudinal opening made in the common femoral artery which was almost completely obliterated with calcific plaque. Endarterectomy was performed up to the proximal clamp. The plaque didn't feather off at the orifice of the profunda nicely with a widely patent profundus. Eversion endarterectomy of the superficial femoral artery which appeared to be almost totally occluded was performed and this came out nicely with some back bleeding. All history was carefully removed the graft was spatulated over a long-distance anastomosed into side of the right common femoral artery with 5-0 Prolene. Following this a site for the femoral-femoral graft to these anastomosed to the axillofemoral graft was selected an 11 blade enlarged with a 5 mm punch. Graft was anastomosed end-to-side to the axillofemoral graft just proximal to the femoral anastomosis using 5-0 Prolene. When this was completed attention was turned to the left inguinal area of the femoral vessels were occluded in a similar fashion. There was a soft spot on the left in the common femoral which allowed a longitudinal arthrotomy was extended down to the superficial femoral which was patent proximally. Passed a Fogarty down the superficial femoral and there was calcific disease of two thirds down the thigh which seemed to be totally occlusive. Profunda was widely patent. In the side anastomosis was done after spatulating the Dacron this was performed with 5-0 Prolene. Following appropriate flushing clamps released there was excellent pulse in all limbs of the axillobifemoral graft and there was good Doppler flow in both profundus and to a lesser degree in the superficial femorals bilaterally. Protamine was given to reverse the heparin following adequate hemostasis the wounds were closed in layers with Vicryl in a subcuticular fashion with Dermabond  patient had Doppler flow in both  feet and taken to recovery in stable condition. He was receiving his first of 2 units of packed red blood cells at the conclusion of the procedure an excellent urinary output stable hemodynamically  Procedure Details:   Josephina Gip, MD 08/19/2013 8:39 AM

## 2013-08-19 NOTE — Anesthesia Preprocedure Evaluation (Addendum)
Anesthesia Evaluation  Patient identified by MRN, date of birth, ID band Patient awake    Reviewed: Allergy & Precautions, H&P , NPO status , Patient's Chart, lab work & pertinent test results  Airway Mallampati: II      Dental  (+) Edentulous Upper, Edentulous Lower and Upper Dentures   Pulmonary COPD COPD inhaler, Current Smoker,          Cardiovascular Exercise Tolerance: Poor hypertension, +CHF + dysrhythmias Atrial Fibrillation     Neuro/Psych    GI/Hepatic   Endo/Other    Renal/GU    Newly diagnosed bladder cancer    Musculoskeletal   Abdominal   Peds  Hematology   Anesthesia Other Findings   Reproductive/Obstetrics                          Anesthesia Physical Anesthesia Plan  ASA: III and emergent  Anesthesia Plan: General   Post-op Pain Management:    Induction: Intravenous, Rapid sequence and Cricoid pressure planned  Airway Management Planned: Oral ETT  Additional Equipment: Arterial line  Intra-op Plan:   Post-operative Plan: Extubation in OR and Possible Post-op intubation/ventilation  Informed Consent: I have reviewed the patients History and Physical, chart, labs and discussed the procedure including the risks, benefits and alternatives for the proposed anesthesia with the patient or authorized representative who has indicated his/her understanding and acceptance.   Dental advisory given  Plan Discussed with: CRNA, Anesthesiologist and Surgeon  Anesthesia Plan Comments:         Anesthesia Quick Evaluation

## 2013-08-19 NOTE — Plan of Care (Signed)
Problem: Phase I Progression Outcomes Goal: Patient status is OR emergent or Short Stay Outcome: Completed/Met Date Met:  08/19/13 Emergent

## 2013-08-19 NOTE — Consult Note (Signed)
PHARMACY NOTE  Assessment/Plan:   Patient is currently receiving Zinacef 1.5 gm IV q 12 hours x 2 doses post-op.  No labs available for evaluating current renal function    Based on estimated population kinetics, there is no current need for dosage adjustments.  Pharmacy will sign off.  If you require further assistance, please re-consult Pharmacy as needed.  Thank you.  Kelsey Durflinger, Elisha Headland,  Pharm.D. , 08/19/2013, 12:21 PM

## 2013-08-19 NOTE — Progress Notes (Signed)
Upon assessment of pulses D.P on the right foot is absent. Two nurses attempted to find pulse with doppler, with no results. All other pulses were found with doppler on feet ( right post tib, left D.P, and Left post tib). MD was paged to make aware of this issue. MD ok with results as long as right post tib pulse is present. Will cont. To monitor and assess pt.

## 2013-08-19 NOTE — Anesthesia Postprocedure Evaluation (Signed)
  Anesthesia Post-op Note  Patient: [name] [name]  Procedure(s) Performed: Procedure(s) with comments: BYPASS GRAFT AXILLA-BIFEMORAL (Bilateral) - Right axilla to right Femoral to Left Femoral bypass use hemoshield grafts.  Patient Location: PACU  Anesthesia Type:General  Level of Consciousness: awake, alert  and oriented  Airway and Oxygen Therapy: Patient Spontanous Breathing and Patient connected to nasal cannula oxygen  Post-op Pain: mild  Post-op Assessment: Post-op Vital signs reviewed, Patient's Cardiovascular Status Stable, Respiratory Function Stable, Patent Airway, Pain level controlled and No backache  Post-op Vital Signs: stable  Complications: No apparent anesthesia complications

## 2013-08-20 DIAGNOSIS — I359 Nonrheumatic aortic valve disorder, unspecified: Secondary | ICD-10-CM

## 2013-08-20 LAB — TYPE AND SCREEN
Antibody Screen: NEGATIVE
Unit division: 0

## 2013-08-20 LAB — BASIC METABOLIC PANEL
BUN: 27 mg/dL — ABNORMAL HIGH (ref 6–23)
CO2: 27 mEq/L (ref 19–32)
Calcium: 8 mg/dL — ABNORMAL LOW (ref 8.4–10.5)
Chloride: 101 mEq/L (ref 96–112)
Creatinine, Ser: 2 mg/dL — ABNORMAL HIGH (ref 0.50–1.35)
GFR calc Af Amer: 36 mL/min — ABNORMAL LOW (ref >90)
Glucose, Bld: 111 mg/dL — ABNORMAL HIGH (ref 70–99)

## 2013-08-20 LAB — CBC
HCT: 32.1 % — ABNORMAL LOW (ref 39.0–52.0)
Hemoglobin: 11.1 g/dL — ABNORMAL LOW (ref 13.0–17.0)
MCH: 32.3 pg (ref 26.0–34.0)
MCV: 93.3 fL (ref 78.0–100.0)
RDW: 15.6 % — ABNORMAL HIGH (ref 11.5–15.5)
WBC: 12 10*3/uL — ABNORMAL HIGH (ref 4.0–10.5)

## 2013-08-20 MED ORDER — DILTIAZEM HCL 60 MG PO TABS
60.0000 mg | ORAL_TABLET | Freq: Three times a day (TID) | ORAL | Status: DC
  Administered 2013-08-20 – 2013-08-22 (×7): 60 mg via ORAL
  Filled 2013-08-20 (×11): qty 1

## 2013-08-20 MED ORDER — METOPROLOL TARTRATE 25 MG PO TABS
25.0000 mg | ORAL_TABLET | Freq: Three times a day (TID) | ORAL | Status: DC
  Administered 2013-08-20 – 2013-08-22 (×6): 25 mg via ORAL
  Filled 2013-08-20 (×11): qty 1

## 2013-08-20 NOTE — Progress Notes (Signed)
       Patient Name: [name] [name]      SUBJECTIVE: without chest pain or shortness of breath  Legs feel better  Past Medical History  Diagnosis Date  . Atrial fibrillation   . HTN (hypertension)   . Bladder cancer   . Cataract   . Claudication   . Tobacco abuse   . Prostate cancer     recent hosp for hematuria  . COPD (chronic obstructive pulmonary disease)   . 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 aspirin  81 mg Oral Daily  . atorvastatin  10 mg Oral q1800  . diltiazem  30 mg Oral Q6H  . docusate sodium  100 mg Oral Daily  . enoxaparin (LOVENOX) injection  30 mg Subcutaneous Q24H  . pantoprazole  40 mg Oral Daily   Continuous Infusions: . sodium chloride 75 mL/hr at 08/19/13 1229  . DOPamine      PHYSICAL EXAM Filed Vitals:   08/20/13 0500 08/20/13 0600 08/20/13 0700 08/20/13 0741  BP: 115/66 118/64 122/60   Pulse:  147 127   Temp:    98.5 F (36.9 C)  TempSrc:    Oral  Resp: 17 16 29    Height:      Weight:      SpO2: 93% 93% 94%     General appearance: alert, cooperative and mild distress Lungs: clear to auscultation bilaterally Heart: irregularly irregular rhythm Extremities: warm to touch Skin: Skin color, texture, turgor normal. No rashes or lesions Neurologic: Alert and oriented X 3, normal strength and tone. Normal symmetric reflexes. Normal coordination and gait  TELEMETRY: Reviewed telemetry pt in  Af RVR:    Intake/Output Summary (Last 24 hours) at 08/20/13 1132 Last data filed at 08/20/13 1100  Gross per 24 hour  Intake 3508.75 ml  Output   1850 ml  Net 1658.75 ml    LABS: Basic Metabolic Panel:  Recent Labs Lab 08/19/13 0725 08/19/13 1800 08/20/13 0400  NA 138  --  138  K 3.4*  --  4.0  CL  --   --  101  CO2  --   --  27  GLUCOSE  --   --  111*  BUN  --   --  27*  CREATININE  --  1.73* 2.00*  CALCIUM  --   --  8.0*   Cardiac Enzymes: No results found for this basename:  CKTOTAL, CKMB, CKMBINDEX, TROPONINI,  in the last 72 hours CBC:  Recent Labs Lab 08/19/13 0725 08/19/13 1800 08/20/13 0400  WBC  --  12.1* 12.0*  HGB 8.5* 12.1* 11.1*  HCT 25.0* 34.7* 32.1*  MCV  --  92.8 93.3  PLT  --  194 190    ASSESSMENT AND PLAN:  Principal Problem:   Aortoiliac occlusive disease Active Problems:   Atrial fibrillation   HTN (hypertension)   renal insufficiency, stage III (moderate)  Afib stillfast  this is chronic so will be not tooaggressive for now  Await echo Renal fuction worsening so will hold off on dig  Signed, Sherryl Manges MD  08/20/2013

## 2013-08-20 NOTE — Progress Notes (Signed)
PT Cancellation Note  Patient Details Name: [name] [name] MRN: [id number] DOB: 1941/03/17   Cancelled Treatment:    Reason Eval/Treat Not Completed: Medical issues which prohibited therapy.  Pt up with nursing postop, remains in rapid afib, will check daily and await stability before full mobility evaluation and progressive ambulation.  Thank you.   Narda Amber [facility] 08/20/2013, 3:13 PM

## 2013-08-20 NOTE — Plan of Care (Signed)
Problem: Phase I Progression Outcomes Goal: OOB as tolerated unless otherwise ordered Outcome: Progressing Got out of bed this morning the first time and did well Goal: Voiding-avoid urinary catheter unless indicated Outcome: Not Applicable Date Met:  08/20/13 Pt admitted with catheter  Problem: Phase II Progression Outcomes Goal: Vital signs stable Outcome: Not Progressing afib rate high and requiring prn meds

## 2013-08-20 NOTE — Progress Notes (Signed)
Patient ID: [name] [name], male   DOB: 12-18-1940, 72 y.o.   MRN: [id number] Vascular Surgery Progress Note  Subjective: One day post emergent right axillofemoral and right to left femoral-femoral bypass for ischemic lower extremities. Patient states no pain or numbness in feet today. Has been out of bed in chair. Appetite is good. He needs to have A. fib with rapid ventricular rate-being controlled by cardiology. Appreciate Dr. Graciela Husbands and associates help.  Objective:  Filed Vitals:   08/20/13 0741  BP:   Pulse:   Temp: 98.5 F (36.9 C)  Resp:     General alert and oriented x3 in no apparent stress Right infraclavicular wound and bilateral inguinal and healing satisfactorily with 3+ pulse in axillofemoral graft and 3+ right to left femoral-femoral graft pulse. Both feet warm and well-perfused. A. fib with a rate varying between 100 and 120. On by mouth Cardizem   Labs:  Recent Labs Lab 08/19/13 1800 08/20/13 0400  CREATININE 1.73* 2.00*    Recent Labs Lab 08/19/13 0725 08/19/13 1800 08/20/13 0400  NA 138  --  138  K 3.4*  --  4.0  CL  --   --  101  CO2  --   --  27  BUN  --   --  27*  CREATININE  --  1.73* 2.00*  GLUCOSE  --   --  111*  CALCIUM  --   --  8.0*    Recent Labs Lab 08/19/13 0725 08/19/13 1800 08/20/13 0400  WBC  --  12.1* 12.0*  HGB 8.5* 12.1* 11.1*  HCT 25.0* 34.7* 32.1*  PLT  --  194 190   No results found for this basename: INR,  in the last 168 hours  I/O last 3 completed shifts: In: 6333.8 [P.O.:1160; I.V.:3898.8; Blood:675; IV Piggyback:600] Out: 2550 [Urine:2350; Blood:200]  Imaging: No results found.  Assessment/Plan:  POD #1  LOS: 1 day  s/p Procedure(s): BYPASS GRAFT AXILLA-BIFEMORAL  Doing well 1 day post right axillobifemoral bypass for ischemia both lower extremities do to aortic occlusion due to atherosclerotic plaque. Patient did not have embolic event. Agree that it would be best to have long-term chronic anti-coagulation  but concern at present time his hematuria from bladder tumor. He was to have seen his urologist tomorrow for possible removal of Foley catheter and further treatment planning. Suspect we should leave Foley catheter in place and hold off on chronic anticoagulations until bladder tumor situation has been resolved. Agree with further workup by cardiology and adjustment of medications for chronic A. fib Will leave in SICU today but can be transferred when felt okay with cardiology   Josephina Gip, MD 08/20/2013 8:05 AM

## 2013-08-20 NOTE — Progress Notes (Signed)
  Echocardiogram 2D Echocardiogram has been performed.  Georgian Co 08/20/2013, 3:20 PM

## 2013-08-21 LAB — BASIC METABOLIC PANEL
BUN: 27 mg/dL — ABNORMAL HIGH (ref 6–23)
CO2: 24 mEq/L (ref 19–32)
Calcium: 8.2 mg/dL — ABNORMAL LOW (ref 8.4–10.5)
Chloride: 100 mEq/L (ref 96–112)
Creatinine, Ser: 2 mg/dL — ABNORMAL HIGH (ref 0.50–1.35)
Glucose, Bld: 119 mg/dL — ABNORMAL HIGH (ref 70–99)

## 2013-08-21 NOTE — Progress Notes (Signed)
Patient ID: [name] [name], male   DOB: [birth year], 72 y.o.   MRN: [id number] Vascular Surgery Progress Note  Subjective: 2 days post urgent right axillobifemoral bypass graft for aortic occlusion. Patient has chronic atrial fibrillation. Rate better controlled past 24 hours on metoprolol 25 mg 3 times a day and Cardizem 60 mg 3 times a day. Patient denies any pain or numbness in lower extremities. Good appetite.  Objective:  Filed Vitals:   08/21/13 0600  BP: 120/68  Pulse: 101  Temp:   Resp: 17    General alert oriented x3 Lungs no rhonchi or wheezing Cardiovascular irregular rhythm with rate in the 80-90 range 3+ pulse in axillobifemoral graft with both the well-perfused Surgical wounds look good   Labs:  Recent Labs Lab 08/19/13 1800 08/20/13 0400 08/21/13 0330  CREATININE 1.73* 2.00* 2.00*    Recent Labs Lab 08/19/13 0725 08/19/13 1800  08/20/13 0400 08/21/13 0330  NA 138  --   --  138 133*  K 3.4*  --   --  4.0 3.8  CL  --   --   --  101 100  CO2  --   --   --  27 24  BUN  --   --   --  27* 27*  CREATININE  --  1.73*  --  2.00* 2.00*  GLUCOSE  --   --   < > 111* 119*  CALCIUM  --   --   --  8.0* 8.2*  < > = values in this interval not displayed.  Recent Labs Lab 08/19/13 0725 08/19/13 1800 08/20/13 0400  WBC  --  12.1* 12.0*  HGB 8.5* 12.1* 11.1*  HCT 25.0* 34.7* 32.1*  PLT  --  194 190   No results found for this basename: INR,  in the last 168 hours  I/O last 3 completed shifts: In: 7813.8 [P.O.:2240; I.V.:4798.8; Blood:675; IV Piggyback:100] Out: 2900 [Urine:2700; Blood:200]  Imaging: No results found.  Assessment/Plan:  POD #2  LOS: 2 days  s/p Procedure(s): BYPASS GRAFT AXILLA-BIFEMORAL  Doing well post urgent right axillobifemoral bypass graft for aortic occlusion. Timing of discharge will be up to cardiology when they feel that rate is adequately controlled The patient could go home anytime from vascular standpoint Could transferred  to 2 W. today and possibly DC in a.m. if okay with cardiology Patient will need to followup with urologist regarding further treatment of bladder tumor-will leave Foley catheter in place as per urology   Josephina Gip, MD 08/21/2013 6:59 AM

## 2013-08-21 NOTE — Progress Notes (Signed)
Nutrition Brief Note  Patient identified on the Malnutrition Screening Tool (MST) Report for recent weight lost without trying (patient unsure).  Wt Readings from Last 15 Encounters:  08/19/13 156 lb 15.5 oz (71.2 kg)  08/19/13 156 lb 15.5 oz (71.2 kg)    Body mass index is 23.87 kg/(m^2). Patient meets criteria for Normal based on current BMI.   Current diet order is Heart Healthy, patient is consuming approximately 80% of meals at this time. Labs and medications reviewed.   No nutrition interventions warranted at this time. If nutrition issues arise, please consult RD.   Maureen Chatters, RD, LDN Pager #: [phone] After-Hours Pager #: [phone]

## 2013-08-21 NOTE — Clinical Documentation Improvement (Signed)
THIS DOCUMENT IS NOT A PERMANENT PART OF THE MEDICAL RECORD  Please update your documentation with the medical record to reflect your response to this query. If you need help knowing how to do this please call [phone].  08/21/13   Dear Dr. Selinda Michaels,  In a better effort to capture your patient's severity of illness, reflect appropriate length of stay and utilization of resources, a review of the patient medical record has revealed the following indicators.    Based on your clinical judgment, please clarify and document in a progress note and/or discharge summary the clinical condition associated with the following supporting information:  In responding to this query please exercise your independent judgment.  The fact that a query is asked, does not imply that any particular answer is desired or expected.   Possible Clinical Conditions?   "    CKD Stage III - GFR 30-59  "    CKD Stage IV - GFR 15-29  "    Other condition  "    Cannot Clinically determine    Risk Factors: Renal Insufficiency, stage III, moderate, noted per 12/07 progress notes.  Lab:  Bun:  12/08:  27  Creatinine: 12/08:  2.00 12/06:  1.73  Calculated GFR:  12/08:  31 12/06:  38  You may use possible, probable, or suspect with inpatient documentation. possible, probable, suspected diagnoses MUST be documented at the time of discharge  Reviewed: additional documentation in the medical record  Thank You,  Marciano Sequin, Clinical Documentation Specialist: [phone] Health Information Management [place]

## 2013-08-21 NOTE — Evaluation (Signed)
Physical Therapy Evaluation Patient Details Name: [name] [name] MRN: [id number] DOB: 1941/05/22 Today's Date: 08/21/2013 Time: 4098-1191 PT Time Calculation (min): 28 min  PT Assessment / Plan / Recommendation History of Present Illness  [name] [name] is a 72 y.o. male who presents for evaluation of pain in both lower extremities left greater than right x12 hours. Patient reported to [facility] emergency department with history of onset of discomfort sometime yesterday left leg greater than right. He has a history of severe buttock and thigh claudication at one half block or walking up inclines. Recently had diagnosis of prostate cancer. Patient has long history of tobacco abuse. He has 50+ pack year history. Emergency department at [facility] perform CT angiogram which reveals a distal aortic occlusion. Transferred to [facility] for emergency surgery  Clinical Impression  Patient presents with decreased independence with mobility due to deficits listed below.  He will benefit from skilled PT in the acute setting to address issues and progress independence to allow d/c home with family assist and HHPT.    PT Assessment  Patient needs continued PT services    Follow Up Recommendations  Home health PT;Supervision/Assistance - 24 hour    Does the patient have the potential to tolerate intense rehabilitation    N/A  Barriers to Discharge  None      Equipment Recommendations  3in1 (PT)    Recommendations for Other Services   None  Frequency Min 3X/week    Precautions / Restrictions Precautions Precautions: Fall   Pertinent Vitals/Pain 6-7/10 right groin with mobility      Mobility  Bed Mobility Bed Mobility: Supine to Sit;Sitting - Scoot to Edge of Bed Supine to Sit: 4: Min assist;HOB elevated;With rails Sitting - Scoot to Edge of Bed: 4: Min assist Details for Bed Mobility Assistance: assist to lift trunk Transfers Transfers: Sit to Stand;Stand to Sit Sit to Stand: 4:  Min assist;From bed;With upper extremity assist Stand to Sit: To chair/3-in-1;4: Min assist Details for Transfer Assistance: cues for hand placement and to return to sit slowly due to pain in right groin Ambulation/Gait Ambulation/Gait Assistance: 4: Min assist;4: Min guard Ambulation Distance (Feet): 160 Feet Assistive device: Rolling walker Ambulation/Gait Assistance Details: cues for proximity to walker and for posture Gait Pattern: Step-to pattern;Antalgic;Decreased stride length    Exercises     PT Diagnosis: Acute pain;Difficulty walking  PT Problem List: Decreased strength;Decreased range of motion;Pain;Decreased balance;Decreased mobility;Decreased activity tolerance;Decreased knowledge of use of DME PT Treatment Interventions: DME instruction;Balance training;Gait training;Stair training;Functional mobility training;Patient/family education;Therapeutic activities;Therapeutic exercise     PT Goals(Current goals can be found in the care plan section) Acute Rehab PT Goals Patient Stated Goal: To return home PT Goal Formulation: With patient/family Time For Goal Achievement: 09/04/13 Potential to Achieve Goals: Good  Visit Information  Last PT Received On: 08/21/13 Assistance Needed: +1 History of Present Illness: [name] [name] is a 72 y.o. male who presents for evaluation of pain in both lower extremities left greater than right x12 hours. Patient reported to [facility] emergency department with history of onset of discomfort sometime yesterday left leg greater than right. He has a history of severe buttock and thigh claudication at one half block or walking up inclines. Recently had diagnosis of prostate cancer. Patient has long history of tobacco abuse. He has 50+ pack year history. Emergency department at [facility] perform CT angiogram which reveals a distal aortic occlusion. Transferred to [place] for emergency surgery       Prior Functioning  Home  Living Family/patient expects to be discharged to:: Private residence Living Arrangements: Spouse/significant other Available Help at Discharge: Family;Available 24 hours/day Type of Home: Mobile home Home Access: Stairs to enter Entrance Stairs-Number of Steps: 2 Entrance Stairs-Rails: Right Home Layout: One level Home Equipment: Walker - 2 wheels Additional Comments: has tub shower and regular height toilet Prior Function Level of Independence: Independent with assistive device(s) Communication Communication: HOH    Cognition  Cognition Arousal/Alertness: Awake/alert Behavior During Therapy: WFL for tasks assessed/performed Overall Cognitive Status: Within Functional Limits for tasks assessed    Extremity/Trunk Assessment Lower Extremity Assessment Lower Extremity Assessment: RLE deficits/detail RLE Deficits / Details: moves with decreased AAROM due to pain in groin Cervical / Trunk Assessment Cervical / Trunk Assessment: Kyphotic   Balance Balance Balance Assessed: Yes Static Standing Balance Static Standing - Balance Support: Bilateral upper extremity supported Static Standing - Level of Assistance: 5: Stand by assistance  End of Session PT - End of Session Equipment Utilized During Treatment: Gait belt Activity Tolerance: Patient limited by pain Patient left: in chair;with call bell/phone within reach;with family/visitor present Nurse Communication: Mobility status  GP     [facility] 08/21/2013, 10:36 AM Sheran Lawless, PT [phone] 08/21/2013

## 2013-08-21 NOTE — Progress Notes (Signed)
   SUBJECTIVE:  No chest pain.  No SOB.    PHYSICAL EXAM Filed Vitals:   08/21/13 0900 08/21/13 1000 08/21/13 1100 08/21/13 1141  BP: 123/67 128/67    Pulse: 86 59 66   Temp:    97.5 F (36.4 C)  TempSrc:    Oral  Resp: 31 21 28    Height:      Weight:      SpO2: 95% 97% 95%    General:  No distress Lungs:  Clear Heart:  Irregular Abdomen:  Positive bowel sounds, no rebound no guarding Extremities:  No edema.   LABS: Lab Results  Component Value Date   TROPONINI  Value: 0.02        NO INDICATION OF MYOCARDIAL INJURY. 04/06/2007   Results for orders placed during the hospital encounter of 08/19/13 (from the past 24 hour(s))  BASIC METABOLIC PANEL     Status: Abnormal   Collection Time    08/21/13  3:30 AM      Result Value Range   Sodium 133 (*) 135 - 145 mEq/L   Potassium 3.8  3.5 - 5.1 mEq/L   Chloride 100  96 - 112 mEq/L   CO2 24  19 - 32 mEq/L   Glucose, Bld 119 (*) 70 - 99 mg/dL   BUN 27 (*) 6 - 23 mg/dL   Creatinine, Ser 1.61 (*) 0.50 - 1.35 mg/dL   Calcium 8.2 (*) 8.4 - 10.5 mg/dL   GFR calc non Af Amer 31 (*) >90 mL/min   GFR calc Af Amer 36 (*) >90 mL/min    Intake/Output Summary (Last 24 hours) at 08/21/13 1225 Last data filed at 08/21/13 1100  Gross per 24 hour  Intake   2705 ml  Output   1150 ml  Net   1555 ml   ASSESSMENT AND PLAN:  Aortoiliac occlusive disease:   Status post right urgent axillobifem bypass.    Cardiomyopathy:  EF 30 - 35%.  This is lower than previous.  He was not following with cardiology.  We will consider further work up in the weeks to come.  For now I will avoid ACE/ARB as the creat is increasing.  If I don't think that he will tolerate this I will start hydral/nitrates before discharge.    Atrial fibrillation:  Rate is OK.  Continue current oral therapy. We will need to consider the appropriate long term anticoagulant when OK with surgery.    HTN (hypertension):   BP OK.     Renal insufficiency, stage III (moderate):  See  above    Rollene Rotunda 08/21/2013 12:25 PM

## 2013-08-21 NOTE — Evaluation (Signed)
Occupational Therapy Evaluation Patient Details Name: [name] [name] MRN: [id number] DOB: 01-10-1941 Today's Date: 08/21/2013 Time: 4098-1191 OT Time Calculation (min): 30 min  OT Assessment / Plan / Recommendation History of present illness [name] [name] is a 72 y.o. male who presents for evaluation of pain in both lower extremities left greater than right x12 hours. Patient reported to [facility] emergency department with history of onset of discomfort sometime yesterday left leg greater than right. He has a history of severe buttock and thigh claudication at one half block or walking up inclines. Recently had diagnosis of prostate cancer. Patient has long history of tobacco abuse. He has 50+ pack year history. Emergency department at [facility] perform CT angiogram which reveals a distal aortic occlusion. Transferred to [place] for urgent right axillobifemoral bypass graft for aortic occlusion   Clinical Impression   Pt admitted with above.  He presents to OT with the below listed deficits and will benefit from continued OT to maximize safety and independence with BADLs.  Pt currently requires mod A for BADLs due to pain.  He is very motivated and should progress to supervision - modified independent level quickly.  Recommend 24 hour - intermittent supervision at discharge.     OT Assessment  Patient needs continued OT Services    Follow Up Recommendations  No OT follow up;Supervision/Assistance - 24 hour    Barriers to Discharge      Equipment Recommendations  3 in 1 bedside comode    Recommendations for Other Services    Frequency  Min 2X/week    Precautions / Restrictions Precautions Precautions: Fall   Pertinent Vitals/Pain     ADL  Eating/Feeding: Independent Where Assessed - Eating/Feeding: Chair Grooming: Wash/dry hands;Wash/dry face;Teeth care;Brushing hair;Set up Where Assessed - Grooming: Supported sitting Upper Body Bathing: Supervision/safety;Set up Where  Assessed - Upper Body Bathing: Supported sitting Lower Body Bathing: Moderate assistance Where Assessed - Lower Body Bathing: Supported sit to stand Upper Body Dressing: Set up;Supervision/safety Where Assessed - Upper Body Dressing: Unsupported sitting Lower Body Dressing: Maximal assistance Where Assessed - Lower Body Dressing: Supported sit to stand Toilet Transfer: Minimal assistance Toilet Transfer Method: Sit to stand;Stand pivot Acupuncturist: Comfort height toilet Toileting - Clothing Manipulation and Hygiene: Moderate assistance Where Assessed - Toileting Clothing Manipulation and Hygiene: Standing Equipment Used: Rolling walker Transfers/Ambulation Related to ADLs: min  assist ADL Comments: Pt unable to don/doff socks due to pain, but able to reach to ankles    OT Diagnosis: Generalized weakness;Acute pain  OT Problem List: Decreased strength;Decreased activity tolerance;Impaired balance (sitting and/or standing);Decreased safety awareness;Decreased knowledge of use of DME or AE;Pain OT Treatment Interventions: Self-care/ADL training;DME and/or AE instruction;Therapeutic activities;Patient/family education;Balance training   OT Goals(Current goals can be found in the care plan section) Acute Rehab OT Goals Patient Stated Goal: to go home OT Goal Formulation: With patient Time For Goal Achievement: 09/04/13 Potential to Achieve Goals: Good ADL Goals Pt Will Perform Grooming: with supervision;standing Pt Will Perform Lower Body Bathing: with supervision;sit to/from stand Pt Will Perform Lower Body Dressing: with supervision;sit to/from stand Pt Will Transfer to Toilet: with supervision;ambulating;regular height toilet;bedside commode;grab bars Pt Will Perform Toileting - Clothing Manipulation and hygiene: with supervision;sit to/from stand Pt Will Perform Tub/Shower Transfer: Tub transfer;with supervision;rolling walker;ambulating;shower seat  Visit Information   Last OT Received On: 08/21/13 Assistance Needed: +1 History of Present Illness: [name] [name] is a 72 y.o. male who presents for evaluation of pain in both lower extremities left greater  than right x12 hours. Patient reported to [facility] emergency department with history of onset of discomfort sometime yesterday left leg greater than right. He has a history of severe buttock and thigh claudication at one half block or walking up inclines. Recently had diagnosis of prostate cancer. Patient has long history of tobacco abuse. He has 50+ pack year history. Emergency department at [facility] perform CT angiogram which reveals a distal aortic occlusion. Transferred to [place] for urgent right axillobifemoral bypass graft for aortic occlusion       Prior Functioning     Home Living Family/patient expects to be discharged to:: Private residence Living Arrangements: Spouse/significant other Available Help at Discharge: Family;Available 24 hours/day Type of Home: Mobile home Home Access: Stairs to enter Entrance Stairs-Number of Steps: 2 Entrance Stairs-Rails: Right Home Layout: One level Home Equipment: Walker - 2 wheels;Shower seat Additional Comments: has tub shower and regular height toilet Prior Function Level of Independence: Independent with assistive device(s) Comments: Does not drive Communication Communication: HOH         Vision/Perception     Cognition  Cognition Arousal/Alertness: Awake/alert Behavior During Therapy: WFL for tasks assessed/performed Overall Cognitive Status: Within Functional Limits for tasks assessed    Extremity/Trunk Assessment Upper Extremity Assessment Upper Extremity Assessment: Overall WFL for tasks assessed Lower Extremity Assessment Lower Extremity Assessment: Defer to PT evaluation RLE Deficits / Details: moves with decreased AAROM due to pain in groin Cervical / Trunk Assessment Cervical / Trunk Assessment: Kyphotic      Mobility Bed Mobility Bed Mobility: Supine to Sit;Sit to Supine Supine to Sit: 4: Min assist;HOB elevated;With rails Sitting - Scoot to Edge of Bed: 4: Min assist Sit to Supine: 4: Min assist;HOB flat Details for Bed Mobility Assistance: assist to lift Rt. LE onto bed Transfers Transfers: Sit to Stand;Stand to Sit Sit to Stand: 4: Min assist;With upper extremity assist;From chair/3-in-1 Stand to Sit: 4: Min assist;With upper extremity assist;To bed Details for Transfer Assistance: assist to steady     Exercise     Balance Balance Balance Assessed: Yes Static Standing Balance Static Standing - Balance Support: Bilateral upper extremity supported Static Standing - Level of Assistance: 5: Stand by assistance   End of Session OT - End of Session Equipment Utilized During Treatment: Rolling walker Activity Tolerance: Patient tolerated treatment well Patient left: in bed;with call bell/phone within reach Nurse Communication: Mobility status  GO     Mylani Gentry M 08/21/2013, 12:39 PM

## 2013-08-21 NOTE — Progress Notes (Signed)
Pt transferred via wheelchair to 2W17 on monitor and on room air.  Positioned comfortably in bed, on monitor, VSS.  Tolerated transfer well.

## 2013-08-22 ENCOUNTER — Telehealth: Payer: Self-pay | Admitting: Vascular Surgery

## 2013-08-22 ENCOUNTER — Encounter (HOSPITAL_COMMUNITY): Payer: Self-pay | Admitting: Vascular Surgery

## 2013-08-22 DIAGNOSIS — I739 Peripheral vascular disease, unspecified: Secondary | ICD-10-CM

## 2013-08-22 LAB — BASIC METABOLIC PANEL
BUN: 32 mg/dL — ABNORMAL HIGH (ref 6–23)
CO2: 24 mEq/L (ref 19–32)
Calcium: 8.5 mg/dL (ref 8.4–10.5)
Creatinine, Ser: 2.03 mg/dL — ABNORMAL HIGH (ref 0.50–1.35)
GFR calc non Af Amer: 31 mL/min — ABNORMAL LOW (ref >90)
Glucose, Bld: 121 mg/dL — ABNORMAL HIGH (ref 70–99)

## 2013-08-22 MED ORDER — ATORVASTATIN CALCIUM 10 MG PO TABS: 10.0000 mg | ORAL_TABLET | Freq: Every day | ORAL | Status: DC

## 2013-08-22 MED ORDER — OXYCODONE-ACETAMINOPHEN 5-325 MG PO TABS: 1.0000 | ORAL_TABLET | Freq: Four times a day (QID) | ORAL | Status: DC | PRN

## 2013-08-22 MED ORDER — DILTIAZEM HCL 60 MG PO TABS: 60.0000 mg | ORAL_TABLET | Freq: Three times a day (TID) | ORAL | Status: DC

## 2013-08-22 MED ORDER — METOPROLOL TARTRATE 25 MG PO TABS: 25.0000 mg | ORAL_TABLET | Freq: Three times a day (TID) | ORAL | Status: DC

## 2013-08-22 NOTE — Care Management Note (Signed)
    Page 1 of 1   08/22/2013     3:18:52 PM   CARE MANAGEMENT NOTE 08/22/2013  Patient:  [name] [name]   Account Number:  [id number]  Date Initiated:  08/22/2013  Documentation initiated by:  Juanelle Trueheart  Subjective/Objective Assessment:   PT S/P AXILLA BIFEMORAL BYPASS GRAFT ON 08/19/13.  PTA, PT RESIDES AT HOME AND IS CARED FOR BY WIFE.  HE IS ACTIVE WITH [facility] FOR [facility].     Action/Plan:   PT FOR DC HOME TODAY WITH SPOUSE.  ORDER FOR HHPT WRITTEN. FAXED REFERRAL TO [facility] AT [phone].  START OF CARE 24-48H POST DC DATE.   Anticipated DC Date:  08/22/2013   Anticipated DC Plan:  HOME W HOME HEALTH SERVICES      DC Planning Services  CM consult      [facility] Choice  HOME HEALTH   Choice offered to / List presented to:  C-3 Spouse        HH arranged  HH-2 PT      [facility] agency  [facility]   Status of service:  Completed, signed off Medicare Important Message given?   (If response is "NO", the following Medicare IM given date fields will be blank) Date Medicare IM given:   Date Additional Medicare IM given:    Discharge Disposition:  HOME W HOME HEALTH SERVICES  Per UR Regulation:  Reviewed for med. necessity/level of care/duration of stay  If discussed at Long Length of Stay Meetings, dates discussed:    Comments:

## 2013-08-22 NOTE — Progress Notes (Addendum)
[facility]  Subjective  - Nauseous sipping on liquids.   Objective 102/60 79 97.7 F (36.5 C) (Oral) 18 93%  Intake/Output Summary (Last 24 hours) at 08/22/13 0737 Last data filed at 08/22/13 0438  Gross per 24 hour  Intake   1285 ml  Output    950 ml  Net    335 ml   3+ pulse in axillobifemoral graft with both the well-perfused  Surgical wounds look good Doppler DP signals skin is warm. Incisions soft, clean and dry    Assessment/Planning: POD #3 post urgent right axillobifemoral bypass graft for aortic occlusion Will give Zofran Ambulating   Clinton Gallant [facility] 08/22/2013 7:37 AM --  Laboratory Lab Results:  Recent Labs  08/19/13 1800 08/20/13 0400  WBC 12.1* 12.0*  HGB 12.1* 11.1*  HCT 34.7* 32.1*  PLT 194 190   BMET  Recent Labs  08/21/13 0330 08/22/13 0445  NA 133* 134*  K 3.8 3.8  CL 100 100  CO2 24 24  GLUCOSE 119* 121*  BUN 27* 32*  CREATININE 2.00* 2.03*  CALCIUM 8.2* 8.5    COAG Lab Results  Component Value Date   INR 1.1 04/06/2007   INR 1.1 04/05/2007   No results found for this basename: PTT      ABIs done-0.85 on the right 0.76 on the left If nausea resolves-possible DC home today  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 Does have chronic kidney disease stage III based on creatinine and BUN

## 2013-08-22 NOTE — Telephone Encounter (Addendum)
Message copied by Fredrich Birks on Tue Aug 22, 2013  1:47 PM ------      Message from: [place], [place] M      Created: Tue Aug 22, 2013 10:33 AM       F/U in the office in 3 weeks with Dr. Hart Rochester        ------  08/22/13: left message for pt regarding appt information, dpm

## 2013-08-22 NOTE — Progress Notes (Signed)
VASCULAR LAB PRELIMINARY  ARTERIAL  ABI completed:    RIGHT    LEFT    PRESSURE WAVEFORM  PRESSURE WAVEFORM  BRACHIAL 131 triphasic BRACHIAL 131 triphasic  DP 46 Dampened monophasic DP 101 Dampened monophasic  AT   AT    PT 98 monophasic PT 113 monophasic  PER   PER    GREAT TOE  NA GREAT TOE  NA    RIGHT LEFT  ABI 0.75 0.86     Raquan Iannone, RVT 08/22/2013, 8:48 AM

## 2013-08-22 NOTE — Progress Notes (Signed)
Occupational Therapy Treatment Patient Details Name: Dionte Blaustein MRN: [id number] DOB: 1940-11-23 Today's Date: 08/22/2013 Time: 9528-4132 OT Time Calculation (min): 23 min  OT Assessment / Plan / Recommendation  History of present illness Eron Staat is a 72 y.o. male who presents for evaluation of pain in both lower extremities left greater than right x12 hours. Patient reported to [facility] emergency department with history of onset of discomfort sometime yesterday left leg greater than right. He has a history of severe buttock and thigh claudication at one half block or walking up inclines. Recently had diagnosis of prostate cancer. Patient has long history of tobacco abuse. He has 50+ pack year history. Emergency department at [facility] perform CT angiogram which reveals a distal aortic occlusion. Transferred to [place] for urgent right axillobifemoral bypass graft for aortic occlusion   OT comments  Pt improving quickly with adls tolerating more lower body adls than yesterday.  Pt has met 3 goals today.  Follow Up Recommendations  No OT follow up;Supervision/Assistance - 24 hour    Barriers to Discharge       Equipment Recommendations  3 in 1 bedside comode    Recommendations for Other Services    Frequency Min 2X/week   Progress towards OT Goals Progress towards OT goals: Progressing toward goals  Plan Discharge plan remains appropriate    Precautions / Restrictions Precautions Precautions: Fall Restrictions Weight Bearing Restrictions: No   Pertinent Vitals/Pain Vitals stable.  Pt did not rate pain.  He just stated it was "sore."    ADL  Eating/Feeding: Performed;Independent Where Assessed - Eating/Feeding: Chair Grooming: Performed;Wash/dry hands;Teeth care;Brushing hair;Modified independent Where Assessed - Grooming: Unsupported standing Lower Body Dressing: Performed;Minimal assistance Where Assessed - Lower Body Dressing: Unsupported sit to  stand Toilet Transfer: Performed;Supervision/safety Toilet Transfer Method: Sit to stand;Stand pivot Acupuncturist: Comfort height toilet Toileting - Clothing Manipulation and Hygiene: Performed;Supervision/safety Where Assessed - Engineer, mining and Hygiene: Standing Equipment Used: Rolling walker Transfers/Ambulation Related to ADLs: no physical assist needed. Pt required VCs for hand placment when standing.   ADL Comments: Pt able to donn/doff socks today while sitting in the chair.  AE not needed.     OT Diagnosis:    OT Problem List:   OT Treatment Interventions:     OT Goals(current goals can now be found in the care plan section) Acute Rehab OT Goals Patient Stated Goal: to go home OT Goal Formulation: With patient Time For Goal Achievement: 09/04/13 Potential to Achieve Goals: Good ADL Goals Pt Will Perform Grooming: with supervision;standing Pt Will Perform Lower Body Bathing: with supervision;sit to/from stand Pt Will Perform Lower Body Dressing: with supervision;sit to/from stand Pt Will Transfer to Toilet: with supervision;ambulating;regular height toilet;bedside commode;grab bars Pt Will Perform Toileting - Clothing Manipulation and hygiene: with supervision;sit to/from stand Pt Will Perform Tub/Shower Transfer: Tub transfer;with supervision;rolling walker;ambulating;shower seat  Visit Information  Last OT Received On: 08/22/13 Assistance Needed: +1 History of Present Illness: Georges Victorio is a 72 y.o. male who presents for evaluation of pain in both lower extremities left greater than right x12 hours. Patient reported to [facility] emergency department with history of onset of discomfort sometime yesterday left leg greater than right. He has a history of severe buttock and thigh claudication at one half block or walking up inclines. Recently had diagnosis of prostate cancer. Patient has long history of tobacco abuse. He has 50+ pack year history.  Emergency department at [facility] perform CT angiogram which reveals a  distal aortic occlusion. Transferred to [place] for urgent right axillobifemoral bypass graft for aortic occlusion    Subjective Data      Prior Functioning       Cognition  Cognition Arousal/Alertness: Awake/alert Behavior During Therapy: WFL for tasks assessed/performed Overall Cognitive Status: Within Functional Limits for tasks assessed    Mobility  Bed Mobility Bed Mobility: Not assessed (pt in chair on arrival.) Supine to Sit: HOB elevated;With rails;4: Min guard Details for Bed Mobility Assistance: assist for safety Transfers Transfers: Sit to Stand;Stand to Sit Sit to Stand: 5: Supervision;From chair/3-in-1;With armrests Stand to Sit: 5: Supervision;To chair/3-in-1;With armrests Details for Transfer Assistance: cues to push up from chair.    Exercises  General Exercises - Lower Extremity Ankle Circles/Pumps: AROM;Both;10 reps;Seated Heel Slides: AROM;Both;10 reps;Seated   Balance Balance Balance Assessed: Yes Static Sitting Balance Static Sitting - Balance Support: Bilateral upper extremity supported;Feet supported Static Sitting - Level of Assistance: 5: Stand by assistance Static Sitting - Comment/# of Minutes: 3 Static Standing Balance Static Standing - Balance Support: Bilateral upper extremity supported Static Standing - Level of Assistance: 5: Stand by assistance Static Standing - Comment/# of Minutes: 4 at sink while gooming Dynamic Standing Balance Dynamic Standing - Balance Support: No upper extremity supported;During functional activity Dynamic Standing - Level of Assistance: 5: Stand by assistance Dynamic Standing - Comments: combing hair at sink about 1 minute   End of Session OT - End of Session Equipment Utilized During Treatment: Rolling walker Activity Tolerance: Patient tolerated treatment well Patient left: in chair;with call bell/phone within reach Nurse  Communication: Mobility status  GO     Hope Budds 08/22/2013, 12:05 PM [phone]

## 2013-08-22 NOTE — Progress Notes (Signed)
   SUBJECTIVE:  No chest pain.  No SOB.  Of note he does not recall me telling him that he has an irregular rhythm or that he has cardiomyopathy   PHYSICAL EXAM Filed Vitals:   08/21/13 2232 08/22/13 0001 08/22/13 0437 08/22/13 0617  BP: 121/68 114/66 112/65 102/60  Pulse: 97  88 79  Temp:   97.7 F (36.5 C)   TempSrc:   Oral   Resp:   18   Height:      Weight:      SpO2:   93%    General:  No distress Lungs:  Clear Heart:  Irregular Abdomen:  Positive bowel sounds, no rebound no guarding Extremities:  No edema.   LABS: Lab Results  Component Value Date   TROPONINI  Value: 0.02        NO INDICATION OF MYOCARDIAL INJURY. 04/06/2007   Results for orders placed during the hospital encounter of 08/19/13 (from the past 24 hour(s))  BASIC METABOLIC PANEL     Status: Abnormal   Collection Time    08/22/13  4:45 AM      Result Value Range   Sodium 134 (*) 135 - 145 mEq/L   Potassium 3.8  3.5 - 5.1 mEq/L   Chloride 100  96 - 112 mEq/L   CO2 24  19 - 32 mEq/L   Glucose, Bld 121 (*) 70 - 99 mg/dL   BUN 32 (*) 6 - 23 mg/dL   Creatinine, Ser 9.60 (*) 0.50 - 1.35 mg/dL   Calcium 8.5  8.4 - 45.4 mg/dL   GFR calc non Af Amer 31 (*) >90 mL/min   GFR calc Af Amer 36 (*) >90 mL/min    Intake/Output Summary (Last 24 hours) at 08/22/13 0929 Last data filed at 08/22/13 0438  Gross per 24 hour  Intake    735 ml  Output    750 ml  Net    -15 ml   ASSESSMENT AND PLAN:  Aortoiliac occlusive disease:   Status post right urgent axillobifem bypass.    Cardiomyopathy:  EF 30 - 35%.  This is lower than previous.  He was not following with cardiology.  We will consider further work up in the weeks to come.  For now I will avoid ACE/ARB as the creat is slightly elevated and BP is soft.    Atrial fibrillation:  Rate is OK.Telemetry reviewed 08/22/2013  Continue current oral therapy. We will need to consider the appropriate long term anticoagulant when OK with surgery and hematuria resolved.   Dr. Candie Chroman comments are noted.    HTN (hypertension):   BP ast above.        [name] [name] [facility] 08/22/2013 9:29 AM

## 2013-08-22 NOTE — Progress Notes (Signed)
Physical Therapy Treatment Patient Details Name: [name] [name] MRN: [id number] DOB: September 13, 1941 Today's Date: 08/22/2013 Time: 4098-1191 PT Time Calculation (min): 23 min  PT Assessment / Plan / Recommendation  History of Present Illness [name] [name] is a 72 y.o. male who presents for evaluation of pain in both lower extremities left greater than right x12 hours. Patient reported to [facility] emergency department with history of onset of discomfort sometime yesterday left leg greater than right. He has a history of severe buttock and thigh claudication at one half block or walking up inclines. Recently had diagnosis of prostate cancer. Patient has long history of tobacco abuse. He has 50+ pack year history. Emergency department at [facility] perform CT angiogram which reveals a distal aortic occlusion. Transferred to [place] for urgent right axillobifemoral bypass graft for aortic occlusion   PT Comments   Progressing with transfers and gait tolerance.  Patient stiff in bilateral groin demonstrates "cowboy"-like gait.  Will benefit from HHPT at d/c to return to prior level of function.  Follow Up Recommendations  Home health PT;Supervision/Assistance - 24 hour     Does the patient have the potential to tolerate intense rehabilitation   n/a  Barriers to Discharge  none      Equipment Recommendations  3in1 (PT)    Recommendations for Other Services  none  Frequency Min 3X/week   Progress towards PT Goals Progress towards PT goals: Progressing toward goals  Plan Current plan remains appropriate    Precautions / Restrictions Precautions Precautions: Fall   Pertinent Vitals/Pain Minimal c/o pain after getting medication earlier    Mobility  Bed Mobility Supine to Sit: HOB elevated;With rails;4: Min guard Details for Bed Mobility Assistance: assist for safety Transfers Sit to Stand: 4: Min assist;From bed;With upper extremity assist Stand to Sit: 5: Supervision;To  chair/3-in-1;With armrests Details for Transfer Assistance: cues for scooting close to edge of bed to stand and to slow descent into chair with UE's Ambulation/Gait Ambulation/Gait Assistance: 5: Supervision;4: Min guard Ambulation Distance (Feet): 200 Feet Assistive device: Rolling walker Ambulation/Gait Assistance Details: cues for posture, proximity to walker Gait Pattern: Step-through pattern;Decreased stride length;Wide base of support;Trunk flexed    Exercises General Exercises - Lower Extremity Ankle Circles/Pumps: AROM;Both;10 reps;Seated Heel Slides: AROM;Both;10 reps;Seated     PT Goals (current goals can now be found in the care plan section)    Visit Information  Last PT Received On: 08/22/13 Assistance Needed: +1 History of Present Illness: [name] [name] is a 72 y.o. male who presents for evaluation of pain in both lower extremities left greater than right x12 hours. Patient reported to [facility] emergency department with history of onset of discomfort sometime yesterday left leg greater than right. He has a history of severe buttock and thigh claudication at one half block or walking up inclines. Recently had diagnosis of prostate cancer. Patient has long history of tobacco abuse. He has 50+ pack year history. Emergency department at [facility] perform CT angiogram which reveals a distal aortic occlusion. Transferred to [place] for urgent right axillobifemoral bypass graft for aortic occlusion    Subjective Data   It's getting better   Cognition  Cognition Arousal/Alertness: Awake/alert Behavior During Therapy: WFL for tasks assessed/performed Overall Cognitive Status: Within Functional Limits for tasks assessed    Balance  Balance Balance Assessed: Yes Dynamic Standing Balance Dynamic Standing - Balance Support: No upper extremity supported;During functional activity Dynamic Standing - Level of Assistance: 5: Stand by assistance Dynamic Standing -  Comments:  combing hair at sink about 1 minute  End of Session PT - End of Session Equipment Utilized During Treatment: Gait belt Activity Tolerance: Patient limited by fatigue Patient left: in chair;with call bell/phone within reach   GP     [facility] 08/22/2013, 11:25 AM Sheran Lawless, PT [phone] 08/22/2013

## 2013-08-23 NOTE — Discharge Summary (Signed)
[facility] Discharge Summary   Patient ID:  [name] [name] MRN: [id number] DOB/AGE: 73-Sep-1942 73 y.o.  Admit date: 08/19/2013 Discharge date: 08/23/2013 Date of Surgery: 08/19/2013 Surgeon: Surgeon(s): Pryor Ochoa, MD  Admission Diagnosis: Surgery Vascular compromise to bilateral lower extremities  Discharge Diagnoses:  Surgery Vascular compromise to bilateral lower extremities  Secondary Diagnoses: Past Medical History  Diagnosis Date  . Atrial fibrillation   . HTN (hypertension)   . Bladder cancer   . Cataract   . Claudication   . Tobacco abuse   . Prostate cancer     recent hosp for hematuria  . COPD (chronic obstructive pulmonary disease)   . 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 male who presents for evaluation of pain in both lower extremities left greater than right x12 hours. Patient reported to [facility] emergency department with history of onset of discomfort sometime yesterday left leg greater than right. He has a history of severe buttock and thigh claudication at one half block or walking up inclines. Recently had diagnosis of prostate cancer. Patient has long history of tobacco abuse. He has 50+ pack year history. Emergency department at [facility] perform CT angiogram which reveals a distal aortic occlusion. Transferred to [place] for emergency surgery  Heunderwent   right axillofemoral bypass using 8 mm Hemashield Dacron graft with extensive right common femoral superficial and profunda femoris endarterectomy and  right to left femoral-femoral bypass using 8 mm Hemashield Dacron graft by dr. Hart Rochester on 08/19/2013.  One day post emergent right axillofemoral and right to left femoral-femoral bypass for ischemic lower extremities. Patient states no pain or numbness in feet today. Has been out of bed in chair. Appetite is  good. He has A. fib with rapid ventricular rate-being controlled by cardiology. Appreciate Dr. Graciela Husbands and associates help. We all agree it would be best to have long-term chronic anti-coagulation but concern at present time his hematuria from bladder tumor. He will be h seen by his urologist tomorrow for possible removal of Foley catheter and further treatment planning. Suspect we should leave Foley catheter in place and hold off on chronic anticoagulations until bladder tumor situation has been resolved.  He was discharged home with foley in place and will follow up with his urologist, and Dr. Antoine Poche cardiology for future anti-coagulation treatment plan due to his A-fib.  He will see Dr. Hart Rochester in 2 weeks.  Home health PT was recommended.  He has a rolling walker at home.  PO medication changes were made and new prescriptions were called into his pharmacy by Clinton Gallant PA-C.      Hospital Course:  [name] is a 72 y.o. male is S/P Neither Procedure(s): BYPASS GRAFT AXILLA-BIFEMORAL Extubated: POD # 0 Physical exam: 3+ pulse in axillobifemoral graft with both the well-perfused  Surgical wounds look good  Doppler DP signals skin is warm.  Incisions soft, clean and dry Pt. Ambulating, voiding with foley intact and taking PO diet without difficulty. Pt pain controlled with PO pain meds. Labs as below Complications:See hospital course   Consults:  Treatment Team:  Cassell Clement, MD  Significant Diagnostic Studies: CBC Lab Results  Component Value Date   WBC 12.0* 08/20/2013   HGB 11.1* 08/20/2013   HCT 32.1* 08/20/2013   MCV 93.3 08/20/2013   PLT 190 08/20/2013    BMET    Component Value  Date/Time   NA 134* 08/22/2013 0445   K 3.8 08/22/2013 0445   CL 100 08/22/2013 0445   CO2 24 08/22/2013 0445   GLUCOSE 121* 08/22/2013 0445   BUN 32* 08/22/2013 0445   CREATININE 2.03* 08/22/2013 0445   CALCIUM 8.5 08/22/2013 0445   GFRNONAA 31* 08/22/2013 0445   GFRAA 36* 08/22/2013 0445    COAG Lab Results  Component Value Date   INR 1.1 04/06/2007   INR 1.1 04/05/2007     Disposition:  Discharge to :Home Discharge Orders   Future Appointments Provider Department Dept Phone   09/12/2013 8:45 AM Pryor Ochoa, MD [facility] [phone]   Future Orders Complete By Expires   Call MD for:  redness, tenderness, or signs of infection (pain, swelling, bleeding, redness, odor or green/yellow discharge around incision site)  As directed    Call MD for:  severe or increased pain, loss or decreased feeling  in affected limb(s)  As directed    Call MD for:  temperature >100.5  As directed    Driving Restrictions  As directed    Comments:     No driving for 2 weeks   Increase activity slowly  As directed    Comments:     Walk with assistance use walker or cane as needed   Lifting restrictions  As directed    Comments:     No lifting for 12 weeks   May shower   As directed    Resume previous diet  As directed        Medication List         atorvastatin 10 MG tablet  Commonly known as:  LIPITOR  Take 1 tablet (10 mg total) by mouth daily at 6 PM.     diltiazem 60 MG tablet  Commonly known as:  CARDIZEM  Take 1 tablet (60 mg total) by mouth 3 (three) times daily.     metoprolol tartrate 25 MG tablet  Commonly known as:  LOPRESSOR  Take 1 tablet (25 mg total) by mouth 3 (three) times daily.     multivitamin with minerals tablet  Take 1 tablet by mouth daily.     oxyCODONE-acetaminophen 5-325 MG per tablet  Commonly known as:  PERCOCET/ROXICET  Take 1 tablet by mouth every 6 (six) hours as needed.       Verbal and written Discharge instructions given to the patient. Wound care per Discharge AVS     Follow-up Information   Follow up with Rollene Rotunda, MD. Schedule an appointment as soon as possible for a visit in 2 weeks. (Please call to make your follow up appoint as soon as you can)    Specialty:  Cardiology   Contact  information:   [address] Jaclyn Prime [place] [address] [phone]       Follow up with Josephina Gip, MD In 2 weeks. (sent)    Specialty:  Vascular Surgery   Contact information:   [address] [place] [address] [phone]       Signed: Clinton Gallant [facility] 08/23/2013, 2:19 PM  - For VQI Registry use --- Instructions: Press F2 to tab through selections.  Delete question if not applicable.   Post-op:  Time to Extubation: [ ]  In OR, [ ]  <12 hrs, [ ]  12-24 hrs, [ ]  > 24 hrs Vasopressors: Yes  ICU Stay: 1 days  Transfusion: Yes  If yes, 2 units given New Arrhythmia: No  Ipsilateral amputation: [x ] no, [ ]  Minor, [ ]  BKA, [ ]  AKA Discharge patency: [ ]  Primary, [x ] Primary assisted, [ ]  Secondary, [ ]  Occluded Patency judged by: [ ]  Dopper only, [ x] Palpable graft pulse, [ ]  Palpable distal pulse, [ ]  ABI inc. > 0.15, [ ]  Duplex Discharge ABI: R 0.75, L 0.77 D/C Ambulatory Status: Ambulatory  Complications: Wound complication: [x ] No, [ ]  Superficial, [ ]  Return to OR  Graft infection: No  Leg ischemia/emboli: [x ] No, [ ]  Yes, no Surgery, [ ]  Yes, Surgery req., [ ]  Amputation If amputation: side: [ ]  R: [ ]  minor, [ ]  BKA, [ ]  AKA; [ ]  L: [ ]  Minor, [ ]  BKA, [ ]  AKA  MI: [x ] No, [ ]  Troponin only, [ ]  EKG or Clinical CHF: No Resp failure: [x ] none, [ ]  Pneumonia, [ ]  Ventilator Chg in renal function: [x ] none, [ ]  Inc. Cr > 0.5, [ ]  Temp. Dialysis, [ ]  Permanent dialysis Stroke: [x ] None, [ ]  Minor, [ ]  Major Return to OR: No  Reason for return to OR: [ ]  Bleeding, [ ]  Infection, [ ]  Thrombosis, [ ]  Revision  Discharge medications: Statin use:  Yes ASA use:  Yes Plavix use:  No  for medical reason   Beta blocker use: No  for medical reason   Coumadin use: No  for medical reason

## 2013-08-28 ENCOUNTER — Telehealth: Payer: Self-pay | Admitting: Cardiology

## 2013-08-28 NOTE — Telephone Encounter (Signed)
Please research patients chart to see if he actually needs to be seen and if its possible for him to be seen in [place] by any provider.

## 2013-08-28 NOTE — Telephone Encounter (Signed)
Scheduled patient to be seen with Dr. Myrtis Ser on 09/06/13 for a new post hosp f/u

## 2013-09-04 ENCOUNTER — Encounter: Payer: Self-pay | Admitting: Cardiology

## 2013-09-04 DIAGNOSIS — J449 Chronic obstructive pulmonary disease, unspecified: Secondary | ICD-10-CM | POA: Insufficient documentation

## 2013-09-04 DIAGNOSIS — R319 Hematuria, unspecified: Secondary | ICD-10-CM | POA: Insufficient documentation

## 2013-09-04 DIAGNOSIS — C61 Malignant neoplasm of prostate: Secondary | ICD-10-CM | POA: Insufficient documentation

## 2013-09-04 DIAGNOSIS — I420 Dilated cardiomyopathy: Secondary | ICD-10-CM | POA: Insufficient documentation

## 2013-09-04 DIAGNOSIS — Z720 Tobacco use: Secondary | ICD-10-CM | POA: Insufficient documentation

## 2013-09-04 DIAGNOSIS — C679 Malignant neoplasm of bladder, unspecified: Secondary | ICD-10-CM | POA: Insufficient documentation

## 2013-09-06 ENCOUNTER — Encounter: Payer: Medicare Other | Admitting: Cardiology

## 2013-09-11 ENCOUNTER — Encounter: Payer: Self-pay | Admitting: Vascular Surgery

## 2013-09-12 ENCOUNTER — Ambulatory Visit (INDEPENDENT_AMBULATORY_CARE_PROVIDER_SITE_OTHER): Payer: Self-pay | Admitting: Vascular Surgery

## 2013-09-12 ENCOUNTER — Encounter: Payer: Self-pay | Admitting: Vascular Surgery

## 2013-09-12 VITALS — BP 136/65 | HR 80 | Ht 68.0 in | Wt 159.0 lb

## 2013-09-12 DIAGNOSIS — I739 Peripheral vascular disease, unspecified: Secondary | ICD-10-CM | POA: Insufficient documentation

## 2013-09-12 DIAGNOSIS — Z48812 Encounter for surgical aftercare following surgery on the circulatory system: Secondary | ICD-10-CM

## 2013-09-12 NOTE — Addendum Note (Signed)
Addended by: Sharee Pimple on: 09/12/2013 09:14 AM   Modules accepted: Orders

## 2013-09-12 NOTE — Progress Notes (Signed)
Subjective:     Patient ID: [name] [name], male   DOB: 18-Feb-1941, 72 y.o.   MRN: [id number]  HPI this 72 year old male returns for initial followup regarding his emergency right axillofemoral and right to left femoral-femoral bypass performed on December 6 for severe ischemia of both lower extremities do 2 aortoiliac occlusive disease. He has done well from this standpoint with complete resolution of his ischemic symptoms in the lower extremities. He is ambulating with a walker currently. He does have a bladder cancer and will be seen at [facility] after the first of the year for further treatment. He has a indwelling catheter with some hematuria.   Review of Systems     Objective:   Physical Exam BP 136/65  Pulse 80  Ht 5\' 8"  (1.727 m)  Wt 159 lb (72.122 kg)  BMI 24.18 kg/m2  SpO2 96%  Gen. elderly male no apparent stress alert and oriented x3 Right axillofemoral and right to left femoral-femoral bypass with 3+ pulse. All incisions healing nicely. The foot has 2+ dorsalis pedis pulse palpable in right foot is well-perfused.      Assessment:     Doing well 3 weeks post emergency right axillofemoral and right to left femoral-femoral bypass for acute occlusion of the aorta Bladder tumor to be further evaluated and treated at [facility] January 2015    Plan:     Return in 3 months with ABIs for further followup

## 2013-09-13 ENCOUNTER — Ambulatory Visit (INDEPENDENT_AMBULATORY_CARE_PROVIDER_SITE_OTHER): Payer: PRIVATE HEALTH INSURANCE | Admitting: Cardiology

## 2013-09-13 ENCOUNTER — Encounter: Payer: Self-pay | Admitting: Cardiology

## 2013-09-13 VITALS — BP 167/78 | HR 82 | Ht 67.0 in | Wt 161.1 lb

## 2013-09-13 DIAGNOSIS — I5022 Chronic systolic (congestive) heart failure: Secondary | ICD-10-CM

## 2013-09-13 DIAGNOSIS — I4891 Unspecified atrial fibrillation: Secondary | ICD-10-CM

## 2013-09-13 DIAGNOSIS — E785 Hyperlipidemia, unspecified: Secondary | ICD-10-CM

## 2013-09-13 DIAGNOSIS — I10 Essential (primary) hypertension: Secondary | ICD-10-CM

## 2013-09-13 MED ORDER — METOPROLOL SUCCINATE ER 100 MG PO TB24: 100.0000 mg | ORAL_TABLET | Freq: Every day | ORAL | Status: DC

## 2013-09-13 NOTE — Progress Notes (Signed)
Clinical Summary [name] [name] is a 72 y.o.male seen today for hospital follow up appointment. He saw Dr Andee Lineman several years ago in 2008, there are no records between 2008 and 2014 in our system.    1. Cardiomyopathy - LVEF 30-35%, unclear etiology at this time.  - reports occasional DOE, sedentary lifestyle. No orthopnea, no PND, no LE edema - limiting sodium intake, no NSAIDs.    2. Atrial fibrillation - has had hematuria, not on anticoag.Per family he has bladder cancer that is followed by urology.   - denies any palpitations  2. HTN - does not check at home - compliant with meds   4. PAD - recent admission with bilateral leg pain, CTA showed distal aortic occlusion - patient underwent right axillofemoral bypass with a Dacron graft with right common femoral superficial and profunda femoris endarterectomy and right to left fem-fem bypass on 08/19/13.   5. Renal insufficiency -discharge Cr was 2, GFR 31 from recent admission.  - he has a chronic foley in that is followed by urology  6. Hyperlipidemia - on statin, compliant   Past Medical History  Diagnosis Date  . Atrial fibrillation   . HTN (hypertension)   . Bladder cancer   . Cataract   . Claudication   . Tobacco abuse   . Prostate cancer     recent hosp for hematuria  . COPD (chronic obstructive pulmonary disease)   . Cardiomyopathy, dilated     EF 45% 2008     No Known Allergies   Current Outpatient Prescriptions  Medication Sig Dispense Refill  . atorvastatin (LIPITOR) 10 MG tablet Take 1 tablet (10 mg total) by mouth daily at 6 PM.  30 tablet  1  . diltiazem (CARDIZEM) 60 MG tablet Take 1 tablet (60 mg total) by mouth 3 (three) times daily.  90 tablet  1  . metoprolol tartrate (LOPRESSOR) 25 MG tablet Take 1 tablet (25 mg total) by mouth 3 (three) times daily.  90 tablet  1  . Multiple Vitamins-Minerals (MULTIVITAMIN WITH MINERALS) tablet Take 1 tablet by mouth daily.      Marland Kitchen oxyCODONE-acetaminophen  (PERCOCET/ROXICET) 5-325 MG per tablet Take 1 tablet by mouth every 6 (six) hours as needed.  30 tablet  0   No current facility-administered medications for this visit.     Past Surgical History  Procedure Laterality Date  . Axillary-femoral bypass graft Bilateral 08/19/2013    Procedure: BYPASS GRAFT AXILLA-BIFEMORAL;  Surgeon: Pryor Ochoa, MD;  Location: [facility] OR;  Service: Vascular;  Laterality: Bilateral;  Right axilla to right Femoral to Left Femoral bypass use hemoshield grafts.     No Known Allergies    No family history on file.   Social History [name] [name] reports that he has been smoking Cigarettes.  He has been smoking about 0.20 packs per day. He uses smokeless tobacco. [name] [name] reports that he does not drink alcohol.   Review of Systems CONSTITUTIONAL: No weight loss, fever, chills, weakness or fatigue.  HEENT: Eyes: No visual loss, blurred vision, double vision or yellow sclerae.No hearing loss, sneezing, congestion, runny nose or sore throat.  SKIN: No rash or itching.  CARDIOVASCULAR: per HPI RESPIRATORY: shortness of breath GASTROINTESTINAL: No anorexia, nausea, vomiting or diarrhea. No abdominal pain or blood.  GENITOURINARY: hematuria NEUROLOGICAL: No headache, dizziness, syncope, paralysis, ataxia, numbness or tingling in the extremities. No change in bowel or bladder control.  MUSCULOSKELETAL: No muscle, back pain, joint pain or stiffness.  LYMPHATICS: No enlarged nodes. No history of splenectomy.  PSYCHIATRIC: No history of depression or anxiety.  ENDOCRINOLOGIC: No reports of sweating, cold or heat intolerance. No polyuria or polydipsia.  Marland Kitchen   Physical Examination p 82 bp 167/78 Wt 161 lbs BMI 25 Gen: resting comfortably, no acute distress HEENT: no scleral icterus, pupils equal round and reactive, no palptable cervical adenopathy,  CV: irreg, no m/r/g, no JVD, no carotid bruits Resp: Clear to auscultation bilaterally GI: abdomen is soft,  non-tender, non-distended, normal bowel sounds, no hepatosplenomegaly MSK: extremities are warm, no edema.  Skin: warm, no rash Neuro:  no focal deficits Psych: appropriate affect   Diagnostic Studies 08/2013 Echo LVEF 30-35%, diffuse hypokinesis, mild AI, PASP 45  09/13/13 Clinic EKG Afib rate 70, LVH with strain pattern  Assessment and Plan  1. 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  - unclear etiology at this time, given his history of PAD he certaintly could have an ischemic cardiomyopathy - will change lopressor to Toprol XL 100mg   in the setting of systolic dysfunction, would utlimately like to get him off dilt as well, will continue for now as it appears that his afib rates were difficult to control while in the hospital. Hopefully as titrate up beta blocker able to get off CCB - no ACE or spironolactone given poor renal function - patient still healing from recent surgery, at some point will need to consider ischemic evaluation, this will be risky due to his renal function  2. Afib - continue rate control. No anticoag due to continued hematuria related to his bladder tumor  3. HTN - follow blood pressure as we titrate his heart failure medications, it is elevated today  4. Bladder tumor - patient followed by urology  5. Hyperlipidemia - continue current statin, repeat lipid panel at follow up.    Follow up 1 month   Antoine Poche, M.D., F.A.C.C.

## 2013-09-13 NOTE — Patient Instructions (Signed)
Your physician recommends that you schedule a follow-up appointment in: 1 month with Dr. Wyline Mood. This appointment will be scheduled today before you leave.  Your physician has recommended you make the following change in your medication:  Stop Lopressor Start: Toprol XL 100 MG once daily  Continue all other medications the same.

## 2013-10-16 ENCOUNTER — Other Ambulatory Visit: Payer: Self-pay | Admitting: Radiology

## 2013-10-16 ENCOUNTER — Other Ambulatory Visit (HOSPITAL_COMMUNITY): Payer: Self-pay | Admitting: Urology

## 2013-10-16 ENCOUNTER — Ambulatory Visit: Payer: Medicare Other | Admitting: Cardiology

## 2013-10-16 DIAGNOSIS — R319 Hematuria, unspecified: Secondary | ICD-10-CM

## 2013-10-16 DIAGNOSIS — C679 Malignant neoplasm of bladder, unspecified: Secondary | ICD-10-CM

## 2013-10-17 ENCOUNTER — Ambulatory Visit (HOSPITAL_COMMUNITY): Admission: RE | Admit: 2013-10-17 | Payer: Medicare Other | Source: Ambulatory Visit

## 2013-10-17 ENCOUNTER — Other Ambulatory Visit (HOSPITAL_COMMUNITY): Payer: Medicare Other | Admitting: Urology

## 2013-10-17 ENCOUNTER — Encounter (HOSPITAL_COMMUNITY): Payer: Self-pay

## 2013-10-17 ENCOUNTER — Ambulatory Visit (HOSPITAL_COMMUNITY)
Admission: RE | Admit: 2013-10-17 | Discharge: 2013-10-17 | Disposition: A | Discharge status: Home / Self Care | Payer: PRIVATE HEALTH INSURANCE | Source: Ambulatory Visit | Attending: Urology | Admitting: Urology

## 2013-10-17 ENCOUNTER — Encounter (HOSPITAL_COMMUNITY): Payer: Self-pay | Admitting: Pharmacy Technician

## 2013-10-17 DIAGNOSIS — N135 Crossing vessel and stricture of ureter without hydronephrosis: Secondary | ICD-10-CM | POA: Insufficient documentation

## 2013-10-17 DIAGNOSIS — R319 Hematuria, unspecified: Secondary | ICD-10-CM

## 2013-10-17 DIAGNOSIS — I4891 Unspecified atrial fibrillation: Secondary | ICD-10-CM | POA: Insufficient documentation

## 2013-10-17 DIAGNOSIS — C679 Malignant neoplasm of bladder, unspecified: Secondary | ICD-10-CM

## 2013-10-17 DIAGNOSIS — C61 Malignant neoplasm of prostate: Secondary | ICD-10-CM | POA: Insufficient documentation

## 2013-10-17 DIAGNOSIS — I739 Peripheral vascular disease, unspecified: Secondary | ICD-10-CM | POA: Insufficient documentation

## 2013-10-17 DIAGNOSIS — R310 Gross hematuria: Secondary | ICD-10-CM | POA: Insufficient documentation

## 2013-10-17 DIAGNOSIS — F17200 Nicotine dependence, unspecified, uncomplicated: Secondary | ICD-10-CM | POA: Insufficient documentation

## 2013-10-17 DIAGNOSIS — I428 Other cardiomyopathies: Secondary | ICD-10-CM | POA: Insufficient documentation

## 2013-10-17 DIAGNOSIS — H269 Unspecified cataract: Secondary | ICD-10-CM | POA: Insufficient documentation

## 2013-10-17 DIAGNOSIS — N1330 Unspecified hydronephrosis: Secondary | ICD-10-CM | POA: Insufficient documentation

## 2013-10-17 DIAGNOSIS — I10 Essential (primary) hypertension: Secondary | ICD-10-CM | POA: Insufficient documentation

## 2013-10-17 DIAGNOSIS — J449 Chronic obstructive pulmonary disease, unspecified: Secondary | ICD-10-CM | POA: Insufficient documentation

## 2013-10-17 DIAGNOSIS — J4489 Other specified chronic obstructive pulmonary disease: Secondary | ICD-10-CM | POA: Insufficient documentation

## 2013-10-17 LAB — APTT: APTT: 29 s (ref 24–37)

## 2013-10-17 MED ORDER — SODIUM CHLORIDE 0.9 % IV SOLN: INTRAVENOUS | Status: DC

## 2013-10-17 MED ORDER — FENTANYL CITRATE 0.05 MG/ML IJ SOLN
INTRAMUSCULAR | Status: DC
Start: 2013-10-17 — End: 2013-10-18
  Filled 2013-10-17: qty 4

## 2013-10-17 MED ORDER — IOHEXOL 300 MG/ML  SOLN
50.0000 mL | Freq: Once | INTRAMUSCULAR | Status: AC | PRN
  Administered 2013-10-17: 20 mL

## 2013-10-17 MED ORDER — FENTANYL CITRATE 0.05 MG/ML IJ SOLN
INTRAMUSCULAR | Status: AC | PRN
Start: 2013-10-17 — End: 2013-10-17
  Administered 2013-10-17: 50 ug via INTRAVENOUS

## 2013-10-17 MED ORDER — CIPROFLOXACIN IN D5W 400 MG/200ML IV SOLN
400.0000 mg | Freq: Once | INTRAVENOUS | Status: DC
  Filled 2013-10-17: qty 200

## 2013-10-17 MED ORDER — MIDAZOLAM HCL 2 MG/2ML IJ SOLN
INTRAMUSCULAR | Status: AC | PRN
  Administered 2013-10-17: 0.5 mg via INTRAVENOUS
  Administered 2013-10-17: 1 mg via INTRAVENOUS

## 2013-10-17 MED ORDER — MIDAZOLAM HCL 2 MG/2ML IJ SOLN
INTRAMUSCULAR | Status: AC
  Filled 2013-10-17: qty 4

## 2013-10-17 NOTE — ED Notes (Signed)
Called [facility] to transfer back to [facility]

## 2013-10-17 NOTE — Procedures (Signed)
Interventional Radiology Procedure Note  Procedure:  1.) Right percutaneous nephroureterogram. 2.) Placement right 61F PCN tube 3.) Left percutaneous nephroureterogram 4.) Placement left 76L PCN tube Complications: None Recommendations: - Maintain tubes to gravity drainage - Once Cr normalized, consider conversion to double J ureteral stents  Signed,  Criselda Peaches, MD [facility] Radiology

## 2013-10-17 NOTE — H&P (Signed)
[name] [name] is an 73 y.o. male.   Chief Complaint: Pt with Hx unresectable bladder Ca Worsening hematuria B obstruction from Bladder Ca--B hydronephrosis Scheduled now for B percutaneous nephrostomy tube placement Plan for JJ stents at later date per Dr Michela Pitcher note Pt will return to [facility] after procedure  HPI: Afib; HTN; Bladder Ca; Cataracts; smoker; prostate Ca; COPD  Past Medical History  Diagnosis Date  . Atrial fibrillation   . HTN (hypertension)   . Bladder cancer   . Cataract   . Claudication   . Tobacco abuse   . Prostate cancer     recent hosp for hematuria  . COPD (chronic obstructive pulmonary disease)   . 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 Axillary-femoral bypass graft Bilateral 08/19/2013    Procedure: BYPASS GRAFT AXILLA-BIFEMORAL;  Surgeon: Mal Misty, MD;  Location: [facility] OR;  Service: Vascular;  Laterality: Bilateral;  Right axilla to right Femoral to Left Femoral bypass use hemoshield grafts.    No family history on file. Social History:  reports that he has been smoking Cigarettes.  He has been smoking about 0.20 packs per day. He uses smokeless tobacco. He reports that he does not drink alcohol or use illicit drugs.  Allergies:  Allergies  Allergen Reactions  . Aspirin Other (See Comments)    Unknown  . Caffeine Other (See Comments)    Unknown  . Phenacetin Other (See Comments)    Unknown     (Not in a hospital admission)  Results for orders placed during the hospital encounter of 10/17/13 (from the past 48 hour(s))  APTT     Status: None   Collection Time    10/17/13 10:16 AM      Result Value Range   aPTT 29  24 - 37 seconds   No results found.  Review of Systems  Constitutional: Positive for weight loss. Negative for fever.  Respiratory: Negative for shortness of breath.   Cardiovascular: Negative for chest pain.  Gastrointestinal: Negative for nausea, vomiting  and abdominal pain.  Genitourinary: Positive for hematuria.  Neurological: Positive for weakness. Negative for dizziness.  Psychiatric/Behavioral: Positive for substance abuse. The patient is not nervous/anxious.        Smoker    Blood pressure 138/78, pulse 135, temperature 97.5 F (36.4 C), temperature source Oral, resp. rate 22, height 6' (1.829 m), weight 160 lb (72.576 kg), SpO2 96.00%. Physical Exam  Constitutional: He is oriented to person, place, and time. He appears well-nourished.  Cardiovascular: Normal rate.   No murmur heard. Irreg HR  Respiratory: Effort normal and breath sounds normal. He has no wheezes.  GI: Soft. Bowel sounds are normal. There is no tenderness.  Musculoskeletal: Normal range of motion.  Neurological: He is alert and oriented to person, place, and time.  Skin: Skin is warm and dry.  Psychiatric: He has a normal mood and affect. His behavior is normal. Judgment and thought content normal.     Assessment/Plan Unresectable Bladder Ca Gross hematuria B obstruction;  B hydronephrosis Scheduled for B PCNs Pt to return to [facility] after procedure Plan for JJs per Dr Michela Pitcher note---later date Pt and family aware of procedure benefits and risks and agreeable to proceed Consent signed and in chart  [place] A 10/17/2013, 11:21 AM

## 2013-10-23 ENCOUNTER — Other Ambulatory Visit (HOSPITAL_COMMUNITY): Payer: Self-pay | Admitting: Urology

## 2013-10-23 DIAGNOSIS — N289 Disorder of kidney and ureter, unspecified: Secondary | ICD-10-CM

## 2013-10-24 ENCOUNTER — Other Ambulatory Visit (HOSPITAL_COMMUNITY): Payer: Self-pay | Admitting: Radiology

## 2013-10-24 ENCOUNTER — Encounter (HOSPITAL_COMMUNITY): Payer: Self-pay | Admitting: Pharmacy Technician

## 2013-10-25 ENCOUNTER — Ambulatory Visit (HOSPITAL_COMMUNITY)
Admission: RE | Admit: 2013-10-25 | Discharge: 2013-10-25 | Disposition: A | Discharge status: Home / Self Care | Payer: PRIVATE HEALTH INSURANCE | Source: Ambulatory Visit | Attending: Urology | Admitting: Urology

## 2013-10-25 ENCOUNTER — Other Ambulatory Visit (HOSPITAL_COMMUNITY): Payer: Self-pay | Admitting: Urology

## 2013-10-25 DIAGNOSIS — N289 Disorder of kidney and ureter, unspecified: Secondary | ICD-10-CM

## 2013-10-25 DIAGNOSIS — C61 Malignant neoplasm of prostate: Secondary | ICD-10-CM | POA: Insufficient documentation

## 2013-10-25 DIAGNOSIS — J449 Chronic obstructive pulmonary disease, unspecified: Secondary | ICD-10-CM | POA: Insufficient documentation

## 2013-10-25 DIAGNOSIS — N135 Crossing vessel and stricture of ureter without hydronephrosis: Secondary | ICD-10-CM | POA: Insufficient documentation

## 2013-10-25 DIAGNOSIS — I4891 Unspecified atrial fibrillation: Secondary | ICD-10-CM | POA: Insufficient documentation

## 2013-10-25 DIAGNOSIS — I10 Essential (primary) hypertension: Secondary | ICD-10-CM | POA: Insufficient documentation

## 2013-10-25 DIAGNOSIS — H269 Unspecified cataract: Secondary | ICD-10-CM | POA: Insufficient documentation

## 2013-10-25 DIAGNOSIS — N1330 Unspecified hydronephrosis: Secondary | ICD-10-CM | POA: Insufficient documentation

## 2013-10-25 DIAGNOSIS — Z436 Encounter for attention to other artificial openings of urinary tract: Secondary | ICD-10-CM | POA: Insufficient documentation

## 2013-10-25 DIAGNOSIS — I739 Peripheral vascular disease, unspecified: Secondary | ICD-10-CM | POA: Insufficient documentation

## 2013-10-25 DIAGNOSIS — C679 Malignant neoplasm of bladder, unspecified: Secondary | ICD-10-CM | POA: Insufficient documentation

## 2013-10-25 DIAGNOSIS — F17200 Nicotine dependence, unspecified, uncomplicated: Secondary | ICD-10-CM | POA: Insufficient documentation

## 2013-10-25 DIAGNOSIS — J4489 Other specified chronic obstructive pulmonary disease: Secondary | ICD-10-CM | POA: Insufficient documentation

## 2013-10-25 DIAGNOSIS — I428 Other cardiomyopathies: Secondary | ICD-10-CM | POA: Insufficient documentation

## 2013-10-25 LAB — CBC
HCT: 33.7 % — ABNORMAL LOW (ref 39.0–52.0)
Hemoglobin: 11.2 g/dL — ABNORMAL LOW (ref 13.0–17.0)
MCH: 29.9 pg (ref 26.0–34.0)
MCHC: 33.2 g/dL (ref 30.0–36.0)
MCV: 90.1 fL (ref 78.0–100.0)
PLATELETS: 280 10*3/uL (ref 150–400)
RBC: 3.74 MIL/uL — ABNORMAL LOW (ref 4.22–5.81)
RDW: 17 % — AB (ref 11.5–15.5)
WBC: 6.1 10*3/uL (ref 4.0–10.5)

## 2013-10-25 LAB — BASIC METABOLIC PANEL
BUN: 32 mg/dL — ABNORMAL HIGH (ref 6–23)
CALCIUM: 8.8 mg/dL (ref 8.4–10.5)
CO2: 23 mEq/L (ref 19–32)
Chloride: 100 mEq/L (ref 96–112)
Creatinine, Ser: 2.39 mg/dL — ABNORMAL HIGH (ref 0.50–1.35)
GFR calc Af Amer: 29 mL/min — ABNORMAL LOW (ref >90)
GFR, EST NON AFRICAN AMERICAN: 25 mL/min — AB (ref >90)
GLUCOSE: 117 mg/dL — AB (ref 70–99)
Potassium: 4.6 mEq/L (ref 3.7–5.3)
Sodium: 140 mEq/L (ref 137–147)

## 2013-10-25 LAB — PROTIME-INR
INR: 1.16 (ref 0.00–1.49)
Prothrombin Time: 14.6 seconds (ref 11.6–15.2)

## 2013-10-25 LAB — APTT: APTT: 29 s (ref 24–37)

## 2013-10-25 MED ORDER — FENTANYL CITRATE 0.05 MG/ML IJ SOLN
INTRAMUSCULAR | Status: AC
  Filled 2013-10-25: qty 2

## 2013-10-25 MED ORDER — SODIUM CHLORIDE 0.9 % IV SOLN: Freq: Once | INTRAVENOUS | Status: DC

## 2013-10-25 MED ORDER — MIDAZOLAM HCL 2 MG/2ML IJ SOLN
INTRAMUSCULAR | Status: DC | PRN
  Administered 2013-10-25: 1 mg via INTRAVENOUS
  Administered 2013-10-25: 0.5 mg via INTRAVENOUS

## 2013-10-25 MED ORDER — MIDAZOLAM HCL 2 MG/2ML IJ SOLN
INTRAMUSCULAR | Status: AC
  Filled 2013-10-25: qty 4

## 2013-10-25 MED ORDER — FENTANYL CITRATE 0.05 MG/ML IJ SOLN
INTRAMUSCULAR | Status: DC | PRN
  Administered 2013-10-25: 50 ug via INTRAVENOUS
  Administered 2013-10-25: 25 ug via INTRAVENOUS

## 2013-10-25 MED ORDER — IOHEXOL 300 MG/ML  SOLN
50.0000 mL | Freq: Once | INTRAMUSCULAR | Status: AC | PRN
  Administered 2013-10-25: 30 mL via URETHRAL

## 2013-10-25 MED ORDER — CIPROFLOXACIN IN D5W 400 MG/200ML IV SOLN
400.0000 mg | Freq: Once | INTRAVENOUS | Status: AC
  Administered 2013-10-25: 400 mg via INTRAVENOUS
  Filled 2013-10-25: qty 200

## 2013-10-25 NOTE — H&P (Signed)
[name] [name] is an 73 y.o. male.   Chief Complaint: "I'm here to get some stents put in" HPI: Patient with history of prostate/unresectable bladder carcinoma, bilateral hydronephrosis with obstructive uropathy and recent placement of bilateral percutaneous nephrostomies on 10/17/13. He presents today for attempted conversion of PCN's to ureteral stents.  Past Medical History  Diagnosis Date  . Atrial fibrillation   . HTN (hypertension)   . Bladder cancer   . Cataract   . Claudication   . Tobacco abuse   . Prostate cancer     recent hosp for hematuria  . COPD (chronic obstructive pulmonary disease)   . 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 Axillary-femoral bypass graft Bilateral 08/19/2013    Procedure: BYPASS GRAFT AXILLA-BIFEMORAL;  Surgeon: Mal Misty, MD;  Location: [facility] OR;  Service: Vascular;  Laterality: Bilateral;  Right axilla to right Femoral to Left Femoral bypass use hemoshield grafts.    No family history on file. Social History:  reports that he has been smoking Cigarettes.  He has been smoking about 0.20 packs per day. He uses smokeless tobacco. He reports that he does not drink alcohol or use illicit drugs.  Allergies:  Allergies  Allergen Reactions  . Aspirin Other (See Comments)    Unknown  . Caffeine Other (See Comments)    Unknown  . Phenacetin Other (See Comments)    Unknown    Current outpatient prescriptions:atorvastatin (LIPITOR) 10 MG tablet, Take 10 mg by mouth daily at 6 PM., Disp: , Rfl: ;  diltiazem (CARDIZEM) 60 MG tablet, Take 60 mg by mouth 3 (three) times daily., Disp: , Rfl: ;  metoprolol succinate (TOPROL-XL) 100 MG 24 hr tablet, Take 100 mg by mouth daily. Take with or immediately following a meal., Disp: , Rfl:  albuterol (PROVENTIL HFA;VENTOLIN HFA) 108 (90 BASE) MCG/ACT inhaler, Inhale into the lungs every 6 (six) hours as needed for wheezing or shortness of breath., Disp: , Rfl: ;   levalbuterol (XOPENEX) 0.63 MG/3ML nebulizer solution, Take 0.63 mg by nebulization every 4 (four) hours as needed for wheezing or shortness of breath., Disp: , Rfl:  Current facility-administered medications:0.9 %  sodium chloride infusion, , Intravenous, Once, Lavonia Drafts, PA-C;  ciprofloxacin (CIPRO) IVPB 400 mg, 400 mg, Intravenous, Once, Lavonia Drafts, PA-C   Results for orders placed during the hospital encounter of 10/25/13 (from the past 48 hour(s))  CBC     Status: Abnormal   Collection Time    10/25/13  9:57 AM      Result Value Ref Range   WBC 6.1  4.0 - 10.5 K/uL   RBC 3.74 (*) 4.22 - 5.81 MIL/uL   Hemoglobin 11.2 (*) 13.0 - 17.0 g/dL   HCT 33.7 (*) 39.0 - 52.0 %   MCV 90.1  78.0 - 100.0 fL   MCH 29.9  26.0 - 34.0 pg   MCHC 33.2  30.0 - 36.0 g/dL   RDW 17.0 (*) 11.5 - 15.5 %   Platelets 280  150 - 400 K/uL   No results found.  Review of Systems  Constitutional: Positive for weight loss. Negative for fever.  Respiratory: Negative for shortness of breath.        Occ cough  Cardiovascular: Negative for chest pain.  Gastrointestinal: Negative for nausea, vomiting and abdominal pain.  Genitourinary: Positive for hematuria.  Musculoskeletal: Positive for back pain.  Neurological: Positive for weakness. Negative  for headaches.   Filed Vitals:   10/25/13 1048  BP: 106/60  Pulse: 77  Temp: 97.8 F (36.6 C)  TempSrc: Oral  Resp: 18  Height: 5\' 7"  (1.702 m)  Weight: 160 lb (72.576 kg)  SpO2: 18%    Physical Exam  Constitutional: He is oriented to person, place, and time.  Thin WM in NAD  Cardiovascular:  irreg irreg   Respiratory: Effort normal.  Sl dim BS left base, right clear  GI: Soft. Bowel sounds are normal. There is no tenderness.  Genitourinary:  bilat PCN's intact, blood-tinged urine in left PCN and yellow urine in right PCN; Foley with bloody urine  Musculoskeletal: Normal range of motion. He exhibits no edema.  Neurological: He is alert and  oriented to person, place, and time.     Assessment/Plan Patient with history of unresectable bladder carcinoma, bilateral hydronephrosis with obstructive uropathy and recent placement of bilateral percutaneous nephrostomies on 10/17/13. He presents today for attempted conversion of PCN's to ureteral stents. Details/risks of procedure d/w pt/family with their understanding and consent.   Braylin Formby,D KEVIN 10/25/2013, 10:36 AM

## 2013-10-25 NOTE — Discharge Instructions (Signed)
Attach nephrostomy to bag if needed

## 2013-10-25 NOTE — Procedures (Signed)
Technically successful conversion of bilateral PCNs to bilateral double J ureteral stents. Given know large bladder tumor, continued temporary percutaneous access was maintained with successful replacement of bilateral 10 Fr PCNs.  No immediate complications.

## 2013-11-06 ENCOUNTER — Other Ambulatory Visit (HOSPITAL_COMMUNITY): Payer: Self-pay | Admitting: Interventional Radiology

## 2013-11-06 DIAGNOSIS — C679 Malignant neoplasm of bladder, unspecified: Secondary | ICD-10-CM

## 2013-11-08 ENCOUNTER — Ambulatory Visit (HOSPITAL_COMMUNITY)
Admission: RE | Admit: 2013-11-08 | Discharge: 2013-11-08 | Disposition: A | Discharge status: Home / Self Care | Payer: PRIVATE HEALTH INSURANCE | Source: Ambulatory Visit | Attending: Interventional Radiology | Admitting: Interventional Radiology

## 2013-11-08 DIAGNOSIS — N139 Obstructive and reflux uropathy, unspecified: Secondary | ICD-10-CM | POA: Insufficient documentation

## 2013-11-08 DIAGNOSIS — Z436 Encounter for attention to other artificial openings of urinary tract: Secondary | ICD-10-CM | POA: Diagnosis not present

## 2013-11-08 DIAGNOSIS — N135 Crossing vessel and stricture of ureter without hydronephrosis: Secondary | ICD-10-CM | POA: Insufficient documentation

## 2013-11-08 DIAGNOSIS — C679 Malignant neoplasm of bladder, unspecified: Secondary | ICD-10-CM

## 2013-11-08 LAB — POCT I-STAT, CHEM 8
BUN: 39 mg/dL — ABNORMAL HIGH (ref 6–23)
CALCIUM ION: 1.3 mmol/L (ref 1.13–1.30)
Chloride: 95 mEq/L — ABNORMAL LOW (ref 96–112)
Creatinine, Ser: 2.1 mg/dL — ABNORMAL HIGH (ref 0.50–1.35)
GLUCOSE: 125 mg/dL — AB (ref 70–99)
HEMATOCRIT: 38 % — AB (ref 39.0–52.0)
HEMOGLOBIN: 12.9 g/dL — AB (ref 13.0–17.0)
Potassium: 4.5 mEq/L (ref 3.7–5.3)
Sodium: 134 mEq/L — ABNORMAL LOW (ref 137–147)
TCO2: 27 mmol/L (ref 0–100)

## 2013-11-08 MED ORDER — IOHEXOL 300 MG/ML  SOLN
50.0000 mL | Freq: Once | INTRAMUSCULAR | Status: AC | PRN
  Administered 2013-11-08: 20 mL via INTRAVENOUS

## 2013-11-09 ENCOUNTER — Other Ambulatory Visit (HOSPITAL_COMMUNITY): Payer: Self-pay | Admitting: Interventional Radiology

## 2013-11-09 DIAGNOSIS — C679 Malignant neoplasm of bladder, unspecified: Secondary | ICD-10-CM

## 2013-12-12 ENCOUNTER — Ambulatory Visit: Payer: Medicare Other | Admitting: Vascular Surgery

## 2013-12-12 ENCOUNTER — Encounter (HOSPITAL_COMMUNITY): Payer: Medicare Other

## 2013-12-13 DEATH — deceased

## 2013-12-26 ENCOUNTER — Telehealth: Payer: Self-pay

## 2013-12-26 NOTE — Telephone Encounter (Signed)
Patient past away @ Home per [facility]  °

## 2015-02-24 IMAGING — XA IR PERC NEPHROSTOMY*R*
1 series · 14 of 14 positions shown · non-contrast
Comparison: none

CLINICAL DATA: 72-year-old male with unresectable bladder cancer,
worsening hematuria and now bilateral ureteral obstruction with
progressive renal dysfunction concerning for obstructed uropathy.
Patient presents as a temporary transfer from [REDACTED]

EXAM:
PERC NEPHROSTOMY*L*; PERC NEPHROSTOMY*R*; LEFT NEPHROSTOGRAM; RIGHT
NEPHROSTOGRAM; IR ULTRASOUND GUIDANCE TISSUE ABLATION
Date: 10/17/2013
TECHNIQUE: Informed consent was obtained from the patient following explanation
of the procedure, risks, benefits and alternatives. The patient
understands, agrees and consents for the procedure. All questions
were addressed. A time out was performed.

[Series 1: run · 14 of 14 slices shown]
[im 1/14]
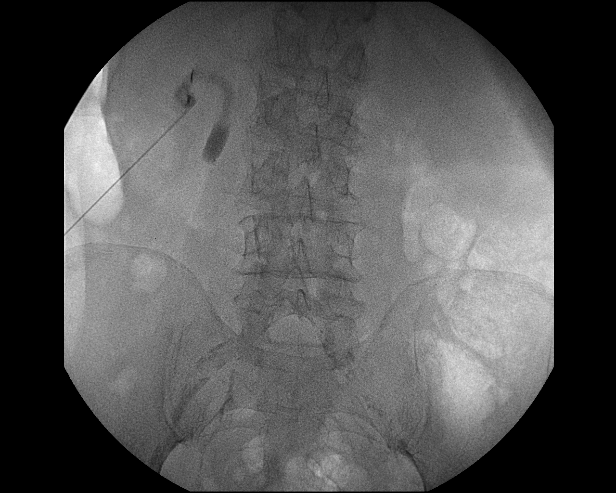
[im 2/14]
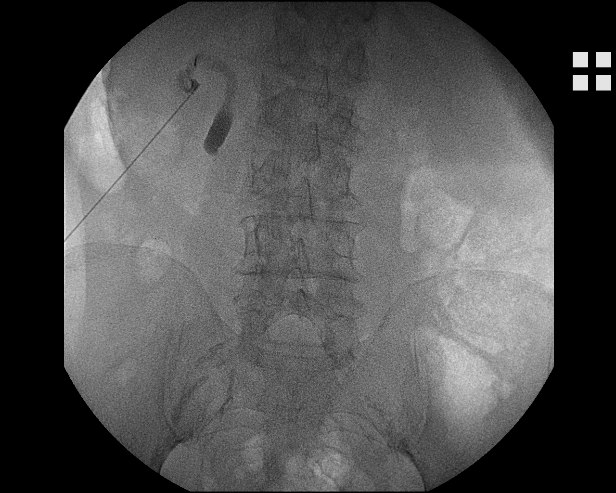
[im 3/14]
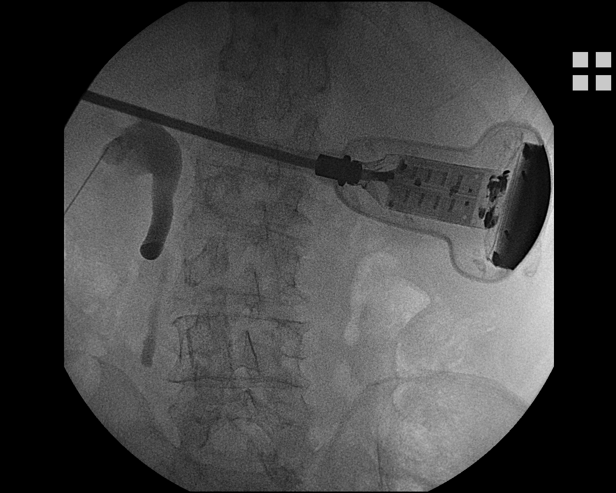
[im 4/14]
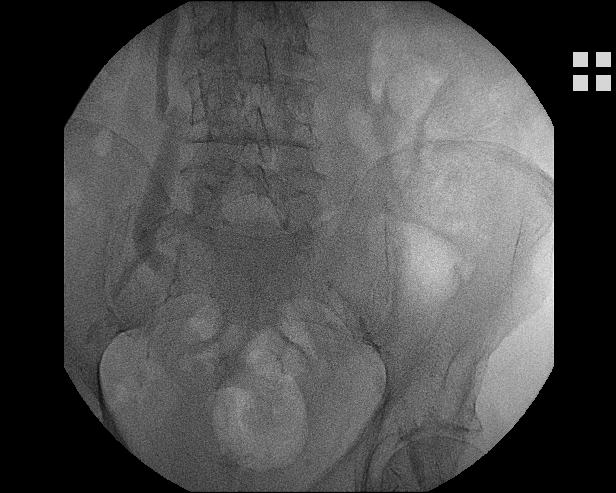
[im 5/14]
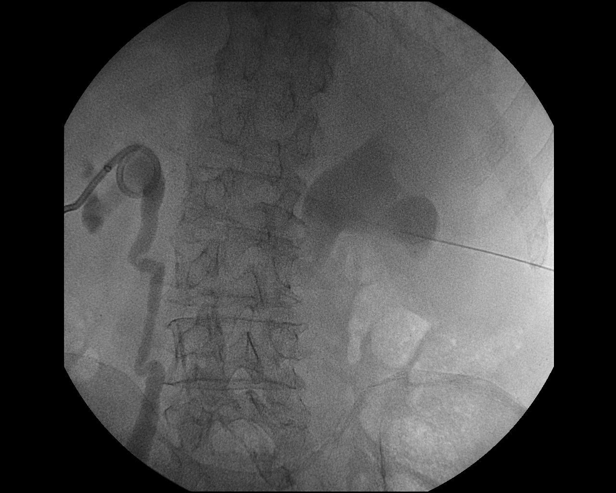
[im 6/14]
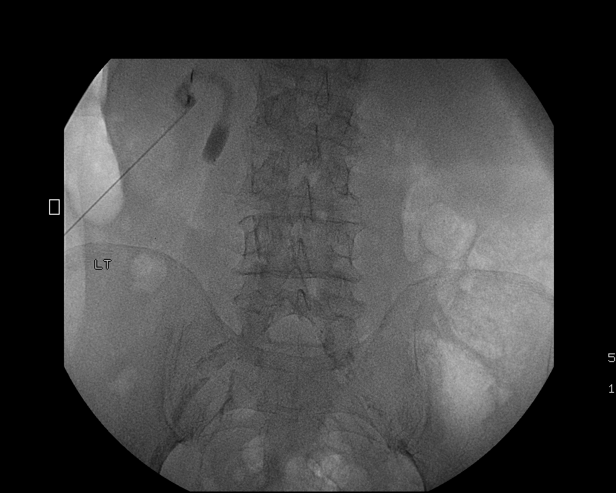
[im 7/14]
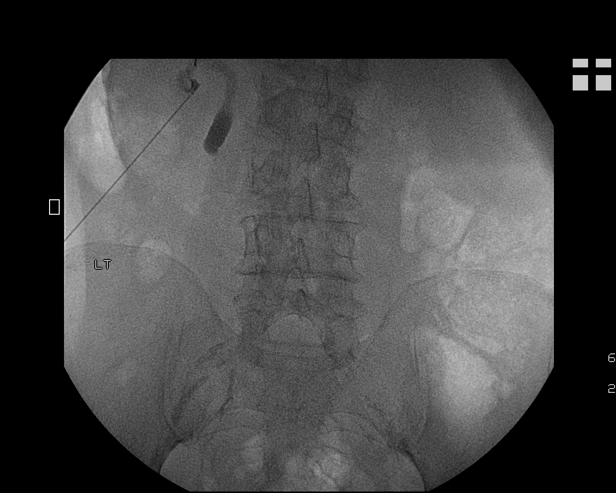
[im 8/14]
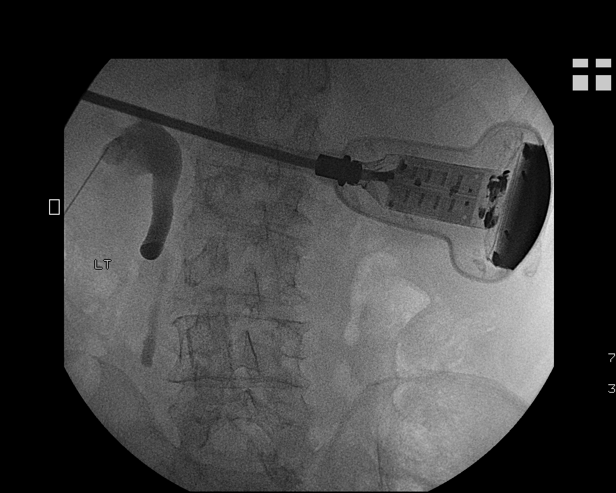
[im 9/14]
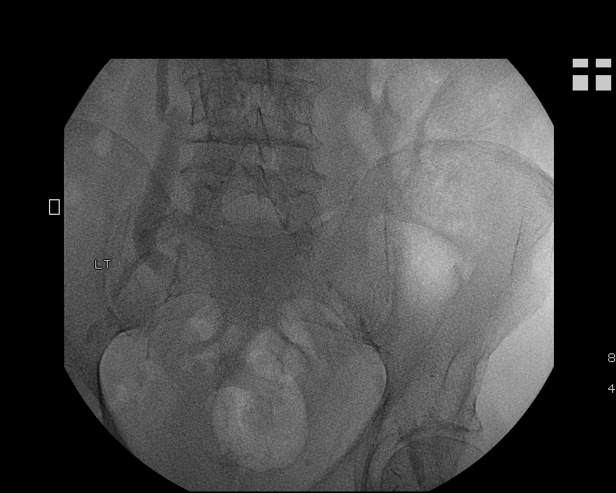
[im 10/14]
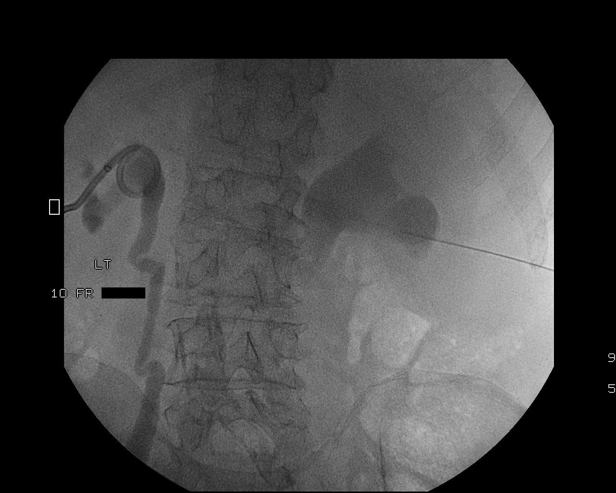
[im 11/14]
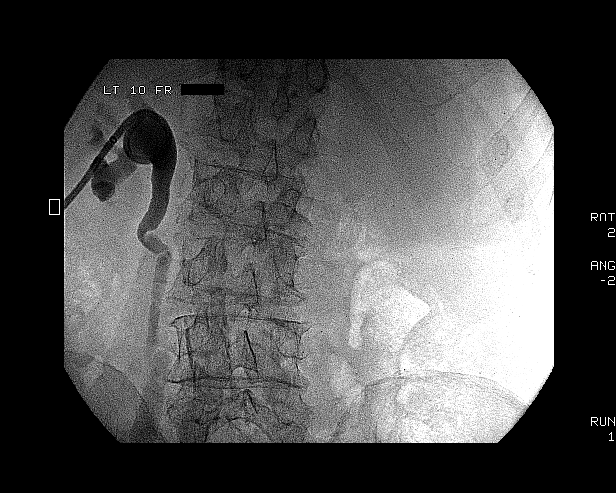
[im 12/14]
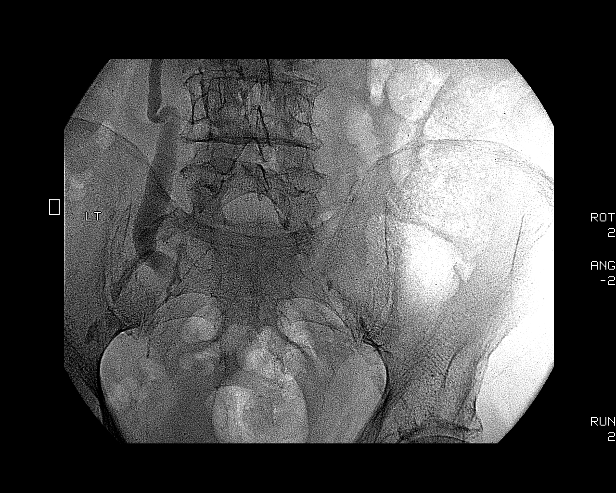
[im 13/14]
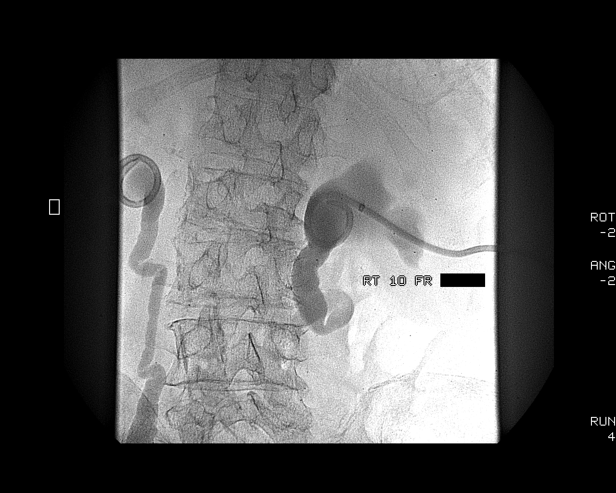
[im 14/14]
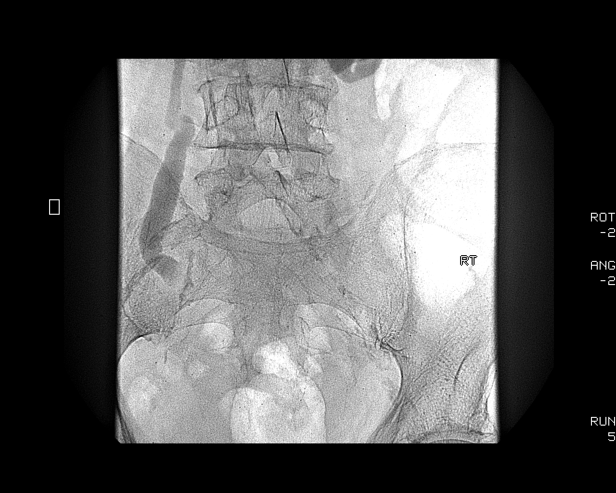

[14 of 14 positions shown; findings below may reference images not displayed]

500 mg ciprofloxacin administered intravenously within 1 hr of skin
incision.

Maximal barrier sterile technique utilized including caps, mask,
sterile gowns, sterile gloves, large sterile drape, hand hygiene,
and Betadine skin prep.

The left flank was interrogated with ultrasound. There is moderate
left hydronephrosis. A suitable skin entry site overlying a
posterior inferior talus was selected and marked. Local anesthesia
was attained by infiltration with 1% lidocaine. A small dermatotomy
was made. Under real-time sonographic guidance, a 21 gauge Accustick
needle was carefully advanced into the calyx. Location within the
renal collecting system was confirmed by return of urine. A gentle
hand injection of contrast material further confirmed location in
the inferior calyx. A 0.018 inch wire was advanced through the renal
collecting system and the needle exchanged for the Accustick sheath.
A second hand injection of contrast material was performed and an
antegrade nephroureterogram was documented. Tortuous and dilated
proximal and mid ureter. There is complete obstruction of the distal
ureter at the level of the mid sacral ale. No contrast material
passes passes this point. No contrast is identified within the
bladder.

A 0.035 Amplatz wire was then advanced into the proximal ureter. The
tract was serially dilated to 10 French and Fabolous 10 French
all-purpose drainage catheter was advanced into the renal pelvis and
formed. Final positioning was confirmed by a gentle hand injection
of contrast material. The tube was flushed and secured to the skin
with 0 Prolene suture and bumper stitch.

Attention was turned to the right flank. The right flank was
interrogated with ultrasound. There is severe right-sided
hydronephrosis.

A suitable skin entry site overlying a posterior inferior talus was
selected and marked. Local anesthesia was attained by infiltration
with 1% lidocaine. A small dermatotomy was made. Under real-time
sonographic guidance, a 21 gauge Accustick needle was carefully
advanced into the calyx. Location within the renal collecting system
was confirmed by return of urine. A gentle hand injection of
contrast material further confirmed location in the inferior calyx.
A 0.018 inch wire was advanced through the renal collecting system
and the needle exchanged for the Accustick sheath. A second hand
injection of contrast material was performed and an antegrade
nephroureterogram was documented. Tortuous and dilated proximal
ureter with a near 360 degree loop at the L3-L4 level. No contrast
material passes beyond this point. No contrast material enters the
bladder.

A 0.035 Amplatz wire was then advanced into the proximal ureter. The
tract was serially dilated to 10 French and Deivinson Basabe 10 French
all-purpose drainage catheter was advanced into the renal pelvis and
formed. Final positioning was confirmed by a gentle hand injection
of contrast material. The tube was flushed and secured to the skin
with 0 Prolene suture and bumper stitch.

The patient tolerated the procedure well. There was no immediate
complication.

ANESTHESIA/SEDATION:
Moderate (conscious) sedation was used. 1.5 mg Versed, 50 mcg
Fentanyl were administered intravenously. The patient's vital signs
were monitored continuously by radiology nursing throughout the
procedure.

Sedation Time: 35 minutes

CONTRAST:  20mL OMNIPAQUE IOHEXOL 300 MG/ML  SOLN

FLUOROSCOPY TIME:  4 min 6 seconds

PROCEDURE:
1. Ultrasound-guided puncture of the left renal collecting system.
2. Antegrade left nephroureterogram.
3. Placement of a right 10 French percutaneous nephrostomy tube with
fluoroscopic guidance.
4. Ultrasound guided puncture of the right renal collecting system.
5. Antegrade right nephroureterogram.
6. Placement of a right 10 French percutaneous nephrostomy tube with
fluoroscopic guidance.
IMPRESSION: 1. Left percutaneous nephroureterogram demonstrates moderate
hydroureteronephrosis. There is complete obstruction of the distal
ureter at the level of the mid sacral a ala.
2. Successful placement of a left 10 F percutaneous nephrostomy
tube.
3. Right percutaneous nephro ureteral g demonstrates severe
hydroureteronephrosis. The ureter is a very tortuous with a near 360
degree loop at the L3-L4 level. No contrast material passes beyond
this point.
4. Successful placement of a right 10 F percutaneous nephrostomy
tube.
Recommend continued gravity bag drainage until creatinine has
normalized. Consider attempted conversion to double-J ureteral
stents in 1-2 weeks.

## 2015-03-18 IMAGING — XA IR PERC INTRO URET CATH*R*
1 series · 6 of 6 positions shown · non-contrast
Comparison: none

CLINICAL DATA: 72-year-old male with bilateral distal ureteral
obstruction an obstructive uropathy. Percutaneous nephrostomy tubes
were converted to bilateral double-J ureteral stents on 10/25/2013.
The patient has stent tolerated capping of the percutaneous
nephrostomy tubes without any issue. A repeat creatinine drawn today
was 2.1 which is decreased compared to 2.39 on the day and the
ureteral stents were placed.

EXAM:
IR BILATERAL NEPHROSTOGRAM THROUGH EXISTING TUBES
Date: 11/08/2013
TECHNIQUE: Informed consent was obtained from the patient following explanation
of the procedure, risks, benefits and alternatives. The patient
understands, agrees and consents for the procedure. All questions
were addressed. A time out was performed.

[Series 1: run · 6 of 6 slices shown]
[im 1/6]
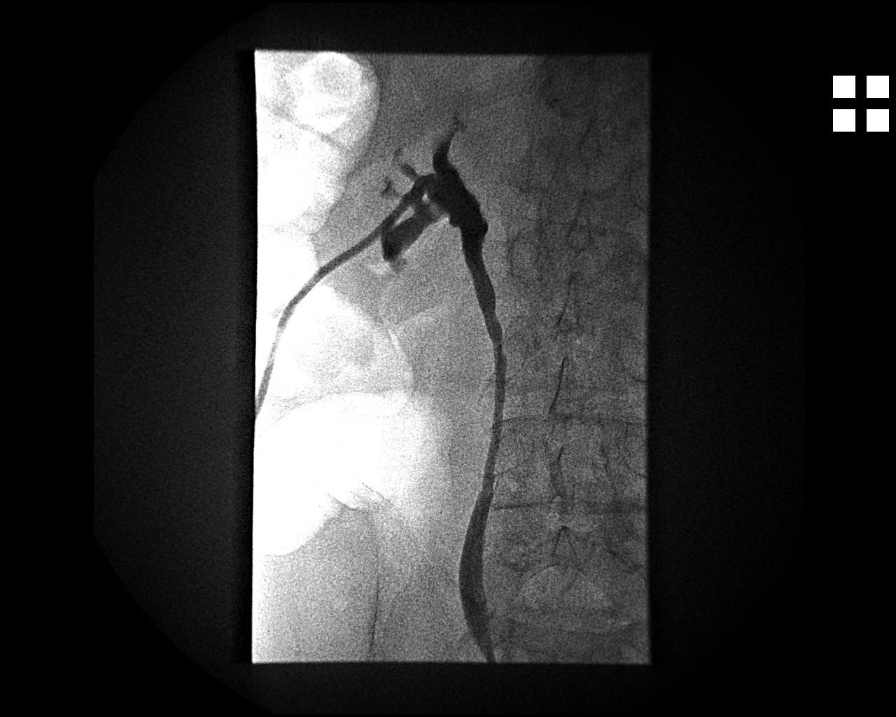
[im 2/6]
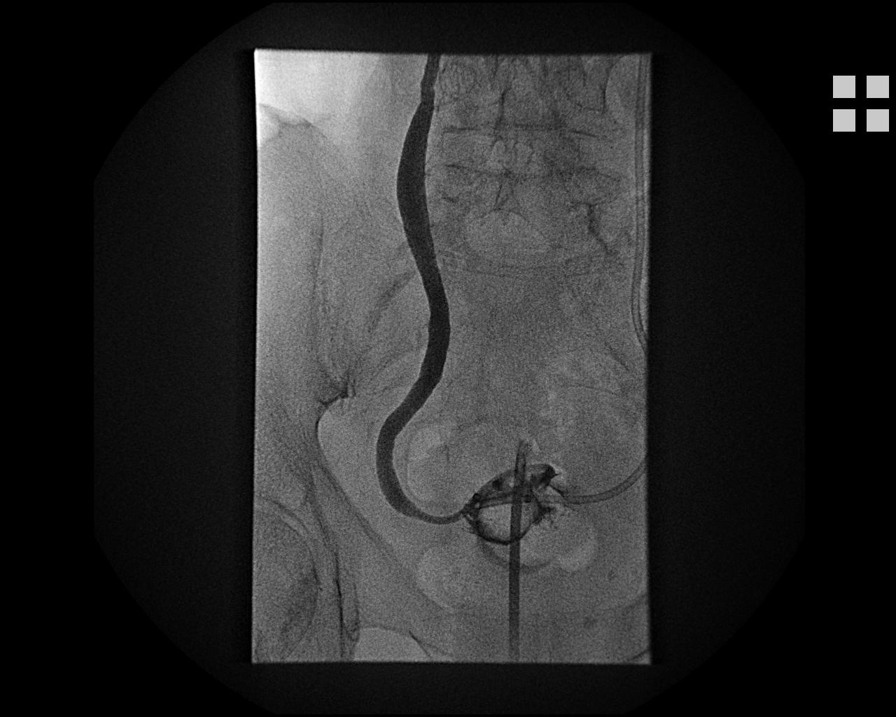
[im 3/6]
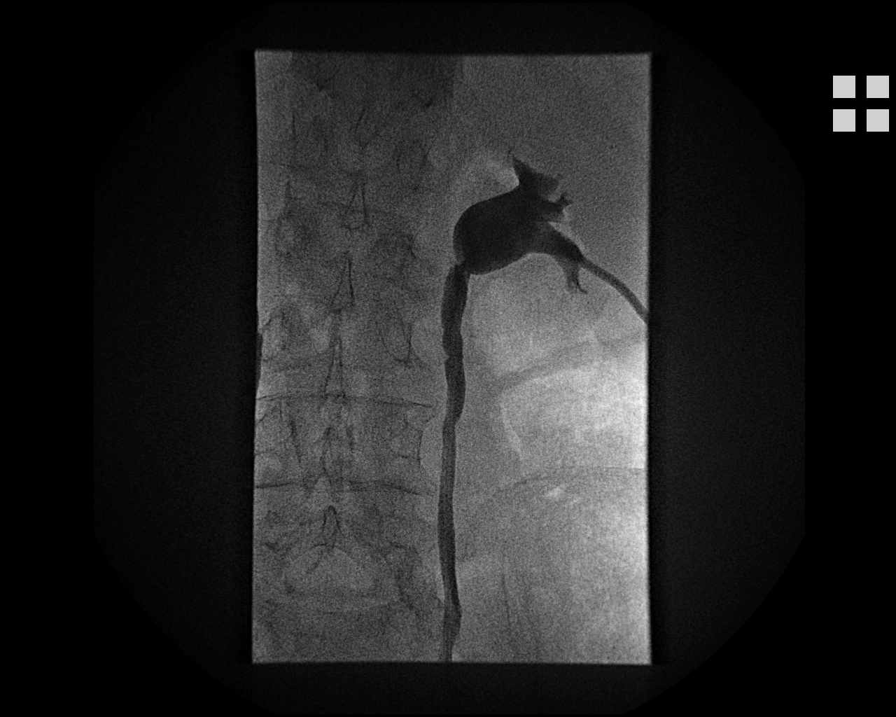
[im 4/6]
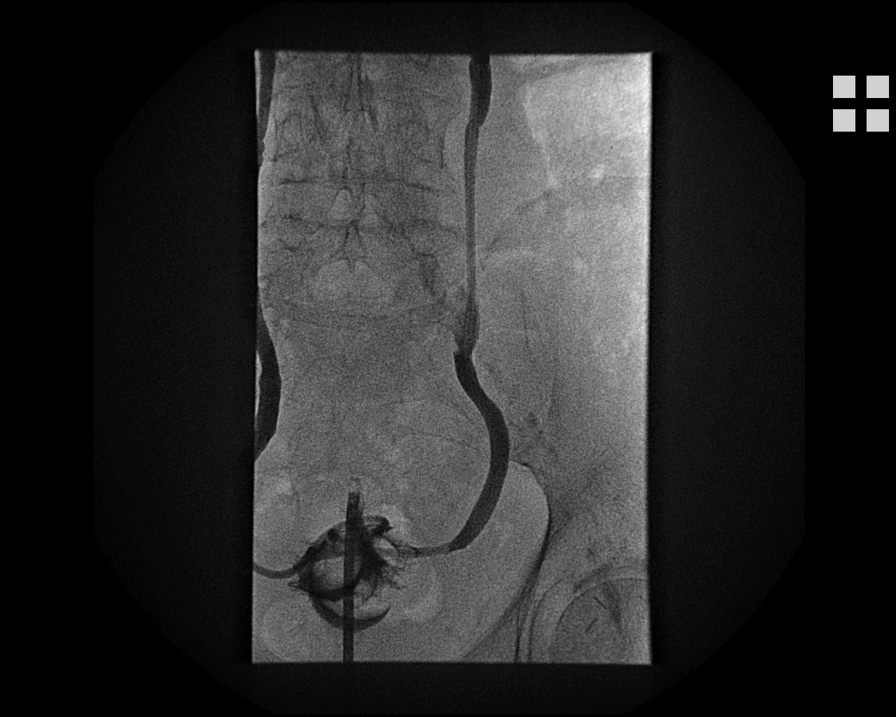
[im 5/6]
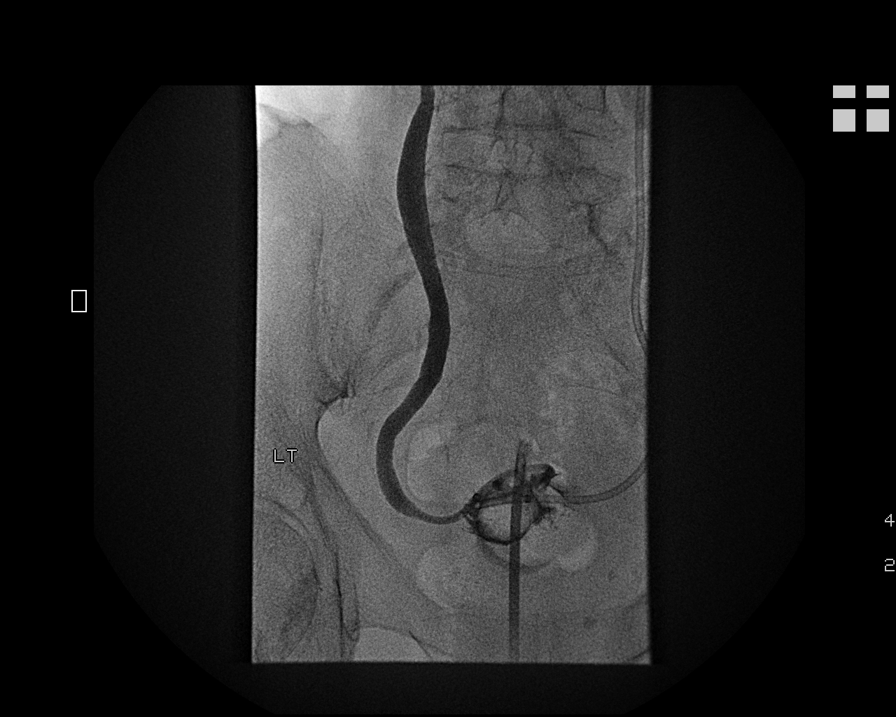
[im 6/6]
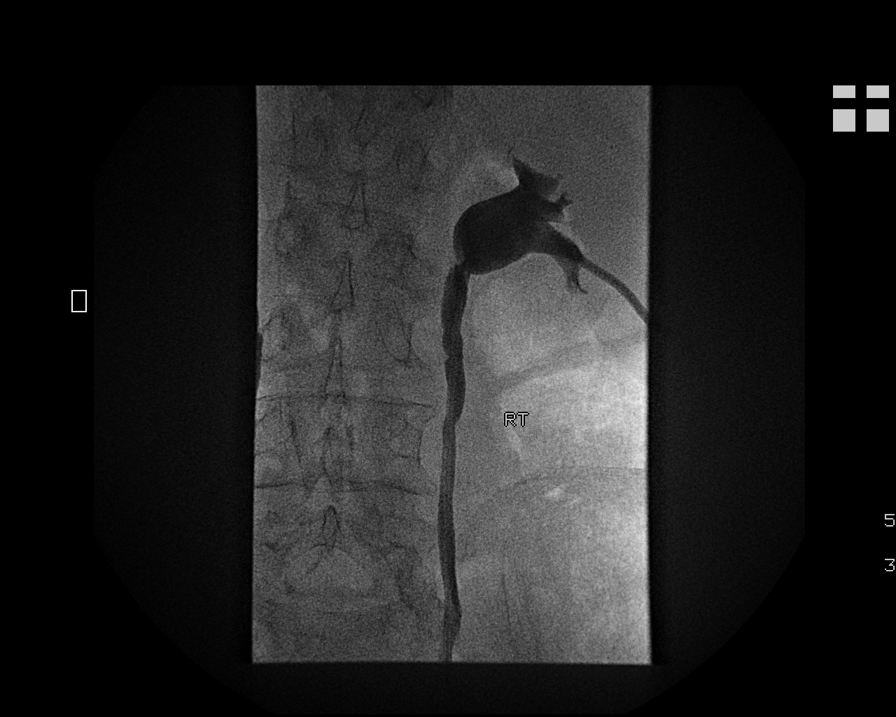

[6 of 6 positions shown; findings below may reference images not displayed]

An initial hand injection of contrast material was performed through
the existing left percutaneous nephrostomy tube. There is no
evidence of hydronephrosis. Contrast material flows freely down the
ureter and a through the ureteral tube and into the bladder

A gentle hand injection of contrast material was then performed in
the right existing percutaneous nephrostomy tube. Again, there is no
hydronephrosis. Contrast material passes through the tube and into
the bladder. A Foley catheter is present in incidentally noted.

Bilateral percutaneous nephrostomy tubes were subsequently cut and
removed. There was no complication, the patient has tolerated the
procedure well.

ANESTHESIA/SEDATION:
None required

CONTRAST:  20mL OMNIPAQUE IOHEXOL 300 MG/ML  SOLN

FLUOROSCOPY TIME:  42 seconds

PROCEDURE:
1. Left nephroureterogram  through existing tube
2. Right nephroureterogram through existing tube
3. Removal of bilateral percutaneous nephrostomy tubes
IMPRESSION: 1. Bilateral nephrostograms demonstrate good flow through the
existing double-J ureteral stents. The patient has tolerated a
capped trial and today's creatinine level is improved compared to
VATS on 10/25/2013.
2. Bilateral percutaneous nephrostomy tubes were removed.
